# Patient Record
Sex: Female | Born: 1965 | Race: Black or African American | Hispanic: No | Marital: Married | Smoking: Never smoker
Health system: Southern US, Community
[De-identification: ages and names within clinical notes are randomized; demographics above are authoritative.]

## PROBLEM LIST (undated history)

## (undated) DIAGNOSIS — N289 Disorder of kidney and ureter, unspecified: Secondary | ICD-10-CM

## (undated) DIAGNOSIS — N200 Calculus of kidney: Secondary | ICD-10-CM

## (undated) DIAGNOSIS — M47819 Spondylosis without myelopathy or radiculopathy, site unspecified: Secondary | ICD-10-CM

## (undated) DIAGNOSIS — I10 Essential (primary) hypertension: Secondary | ICD-10-CM

## (undated) DIAGNOSIS — M109 Gout, unspecified: Secondary | ICD-10-CM

## (undated) HISTORY — PX: ADENOIDECTOMY: SUR15

## (undated) HISTORY — PX: ABDOMINAL HYSTERECTOMY: SHX81

## (undated) HISTORY — PX: TUBAL LIGATION: SHX77

## (undated) HISTORY — PX: OTHER SURGICAL HISTORY: SHX169

## (undated) HISTORY — PX: TONSILLECTOMY: SUR1361

---

## 2009-12-13 ENCOUNTER — Emergency Department (HOSPITAL_COMMUNITY): Admission: EM | Admit: 2009-12-13 | Discharge: 2009-12-13 | Payer: Self-pay | Admitting: Emergency Medicine

## 2010-02-27 ENCOUNTER — Emergency Department (HOSPITAL_COMMUNITY): Admission: EM | Admit: 2010-02-27 | Discharge: 2010-02-27 | Payer: Self-pay | Admitting: Emergency Medicine

## 2010-04-15 ENCOUNTER — Emergency Department (HOSPITAL_COMMUNITY): Admission: EM | Admit: 2010-04-15 | Discharge: 2010-04-15 | Payer: Self-pay | Admitting: Emergency Medicine

## 2010-05-01 ENCOUNTER — Emergency Department (HOSPITAL_COMMUNITY): Admission: EM | Admit: 2010-05-01 | Discharge: 2010-05-01 | Payer: Self-pay | Admitting: Emergency Medicine

## 2010-08-28 ENCOUNTER — Encounter: Payer: Self-pay | Admitting: Pain Medicine

## 2010-10-21 LAB — RAPID URINE DRUG SCREEN, HOSP PERFORMED
Amphetamines: NOT DETECTED
Barbiturates: NOT DETECTED
Benzodiazepines: NOT DETECTED
Cocaine: NOT DETECTED
Opiates: POSITIVE — AB
Tetrahydrocannabinol: NOT DETECTED

## 2010-10-21 LAB — URINALYSIS, ROUTINE W REFLEX MICROSCOPIC
Bilirubin Urine: NEGATIVE
Bilirubin Urine: NEGATIVE
Glucose, UA: NEGATIVE mg/dL
Glucose, UA: NEGATIVE mg/dL
Hgb urine dipstick: NEGATIVE
Hgb urine dipstick: NEGATIVE
Ketones, ur: 15 mg/dL — AB
Ketones, ur: NEGATIVE mg/dL
Leukocytes, UA: NEGATIVE
Nitrite: NEGATIVE
Nitrite: NEGATIVE
Protein, ur: NEGATIVE mg/dL
Protein, ur: 100 mg/dL — AB
Specific Gravity, Urine: 1.02 (ref 1.005–1.030)
Specific Gravity, Urine: 1.015 (ref 1.005–1.030)
Urobilinogen, UA: 1 mg/dL (ref 0.0–1.0)
Urobilinogen, UA: 0.2 mg/dL (ref 0.0–1.0)
pH: 7 (ref 5.0–8.0)
pH: 6.5 (ref 5.0–8.0)

## 2010-10-21 LAB — COMPREHENSIVE METABOLIC PANEL
ALT: 22 U/L (ref 0–35)
ALT: 21 U/L (ref 0–35)
AST: 26 U/L (ref 0–37)
AST: 28 U/L (ref 0–37)
Albumin: 4.2 g/dL (ref 3.5–5.2)
Albumin: 4.1 g/dL (ref 3.5–5.2)
Alkaline Phosphatase: 41 U/L (ref 39–117)
Alkaline Phosphatase: 42 U/L (ref 39–117)
BUN: 15 mg/dL (ref 6–23)
BUN: 12 mg/dL (ref 6–23)
CO2: 28 mEq/L (ref 19–32)
CO2: 28 mEq/L (ref 19–32)
Calcium: 9.6 mg/dL (ref 8.4–10.5)
Calcium: 9.2 mg/dL (ref 8.4–10.5)
Chloride: 103 mEq/L (ref 96–112)
Chloride: 104 mEq/L (ref 96–112)
Creatinine, Ser: 0.87 mg/dL (ref 0.4–1.2)
Creatinine, Ser: 1.02 mg/dL (ref 0.4–1.2)
GFR calc Af Amer: >60 mL/min (ref >60)
GFR calc Af Amer: >60 mL/min (ref >60)
GFR calc non Af Amer: >60 mL/min (ref >60)
GFR calc non Af Amer: 59 mL/min — ABNORMAL LOW (ref >60)
Glucose, Bld: 101 mg/dL — ABNORMAL HIGH (ref 70–99)
Glucose, Bld: 116 mg/dL — ABNORMAL HIGH (ref 70–99)
Potassium: 3.4 mEq/L — ABNORMAL LOW (ref 3.5–5.1)
Potassium: 2.6 mEq/L — CL (ref 3.5–5.1)
Sodium: 141 mEq/L (ref 135–145)
Sodium: 142 mEq/L (ref 135–145)
Total Bilirubin: 1 mg/dL (ref 0.3–1.2)
Total Bilirubin: 0.4 mg/dL (ref 0.3–1.2)
Total Protein: 7.7 g/dL (ref 6.0–8.3)
Total Protein: 7.5 g/dL (ref 6.0–8.3)

## 2010-10-21 LAB — CBC
HCT: 38.5 % (ref 36.0–46.0)
HCT: 40.5 % (ref 36.0–46.0)
Hemoglobin: 12.9 g/dL (ref 12.0–15.0)
Hemoglobin: 13.4 g/dL (ref 12.0–15.0)
MCH: 28 pg (ref 26.0–34.0)
MCH: 27.5 pg (ref 26.0–34.0)
MCHC: 33.5 g/dL (ref 30.0–36.0)
MCHC: 33.1 g/dL (ref 30.0–36.0)
MCV: 83.6 fL (ref 78.0–100.0)
MCV: 83 fL (ref 78.0–100.0)
Platelets: 197 10*3/uL (ref 150–400)
Platelets: 170 10*3/uL (ref 150–400)
RBC: 4.6 MIL/uL (ref 3.87–5.11)
RBC: 4.88 MIL/uL (ref 3.87–5.11)
RDW: 15.4 % (ref 11.5–15.5)
RDW: 14.8 % (ref 11.5–15.5)
WBC: 5.9 10*3/uL (ref 4.0–10.5)
WBC: 7.7 10*3/uL (ref 4.0–10.5)

## 2010-10-21 LAB — DIFFERENTIAL
Basophils Absolute: 0.1 10*3/uL (ref 0.0–0.1)
Basophils Absolute: 0.1 10*3/uL (ref 0.0–0.1)
Basophils Relative: 1 % (ref 0–1)
Basophils Relative: 1 % (ref 0–1)
Eosinophils Absolute: 0 10*3/uL (ref 0.0–0.7)
Eosinophils Absolute: 0 10*3/uL (ref 0.0–0.7)
Eosinophils Relative: 0 % (ref 0–5)
Eosinophils Relative: 0 % (ref 0–5)
Lymphocytes Relative: 35 % (ref 12–46)
Lymphocytes Relative: 26 % (ref 12–46)
Lymphs Abs: 2.1 10*3/uL (ref 0.7–4.0)
Lymphs Abs: 2 10*3/uL (ref 0.7–4.0)
Monocytes Absolute: 0.4 10*3/uL (ref 0.1–1.0)
Monocytes Absolute: 0.7 10*3/uL (ref 0.1–1.0)
Monocytes Relative: 6 % (ref 3–12)
Monocytes Relative: 9 % (ref 3–12)
Neutro Abs: 3.3 10*3/uL (ref 1.7–7.7)
Neutro Abs: 5 10*3/uL (ref 1.7–7.7)
Neutrophils Relative %: 58 % (ref 43–77)
Neutrophils Relative %: 64 % (ref 43–77)

## 2010-10-21 LAB — LIPASE, BLOOD: Lipase: 35 U/L (ref 11–59)

## 2010-10-21 LAB — URINE MICROSCOPIC-ADD ON

## 2010-10-21 LAB — ETHANOL: Alcohol, Ethyl (B): <5 mg/dL (ref 0–10)

## 2010-10-21 LAB — TSH: TSH: 0.152 u[IU]/mL — ABNORMAL LOW (ref 0.350–4.500)

## 2010-10-21 LAB — PREGNANCY, URINE: Preg Test, Ur: NEGATIVE

## 2010-10-21 LAB — POTASSIUM: Potassium: 3.1 mEq/L — ABNORMAL LOW (ref 3.5–5.1)

## 2011-03-28 ENCOUNTER — Emergency Department (HOSPITAL_COMMUNITY)
Admission: EM | Admit: 2011-03-28 | Discharge: 2011-03-28 | Disposition: A | Discharge status: Home / Self Care | Attending: Emergency Medicine | Admitting: Emergency Medicine

## 2011-03-28 ENCOUNTER — Emergency Department (HOSPITAL_COMMUNITY)

## 2011-03-28 DIAGNOSIS — M545 Low back pain, unspecified: Secondary | ICD-10-CM | POA: Insufficient documentation

## 2011-03-28 DIAGNOSIS — J45909 Unspecified asthma, uncomplicated: Secondary | ICD-10-CM | POA: Insufficient documentation

## 2011-03-28 DIAGNOSIS — F329 Major depressive disorder, single episode, unspecified: Secondary | ICD-10-CM | POA: Insufficient documentation

## 2011-03-28 DIAGNOSIS — M25559 Pain in unspecified hip: Secondary | ICD-10-CM | POA: Insufficient documentation

## 2011-03-28 DIAGNOSIS — I10 Essential (primary) hypertension: Secondary | ICD-10-CM | POA: Insufficient documentation

## 2011-03-28 DIAGNOSIS — F3289 Other specified depressive episodes: Secondary | ICD-10-CM | POA: Insufficient documentation

## 2011-03-28 DIAGNOSIS — M5430 Sciatica, unspecified side: Secondary | ICD-10-CM | POA: Insufficient documentation

## 2011-08-01 ENCOUNTER — Other Ambulatory Visit: Payer: Self-pay

## 2011-08-01 ENCOUNTER — Emergency Department (HOSPITAL_COMMUNITY)
Admission: EM | Admit: 2011-08-01 | Discharge: 2011-08-01 | Disposition: A | Discharge status: Home / Self Care | Attending: Emergency Medicine | Admitting: Emergency Medicine

## 2011-08-01 ENCOUNTER — Encounter: Payer: Self-pay | Admitting: *Deleted

## 2011-08-01 DIAGNOSIS — IMO0001 Reserved for inherently not codable concepts without codable children: Secondary | ICD-10-CM | POA: Insufficient documentation

## 2011-08-01 DIAGNOSIS — M7918 Myalgia, other site: Secondary | ICD-10-CM

## 2011-08-01 DIAGNOSIS — E669 Obesity, unspecified: Secondary | ICD-10-CM | POA: Insufficient documentation

## 2011-08-01 DIAGNOSIS — R079 Chest pain, unspecified: Secondary | ICD-10-CM | POA: Insufficient documentation

## 2011-08-01 DIAGNOSIS — I10 Essential (primary) hypertension: Secondary | ICD-10-CM | POA: Insufficient documentation

## 2011-08-01 HISTORY — DX: Essential (primary) hypertension: I10

## 2011-08-01 HISTORY — DX: Spondylosis without myelopathy or radiculopathy, site unspecified: M47.819

## 2011-08-01 MED ORDER — IBUPROFEN 800 MG PO TABS
800.0000 mg | ORAL_TABLET | Freq: Once | ORAL | Status: AC
  Administered 2011-08-01: 800 mg via ORAL
  Filled 2011-08-01: qty 1

## 2011-08-01 MED ORDER — CYCLOBENZAPRINE HCL 10 MG PO TABS
10.0000 mg | ORAL_TABLET | Freq: Once | ORAL | Status: AC
  Administered 2011-08-01: 10 mg via ORAL
  Filled 2011-08-01: qty 1

## 2011-08-01 MED ORDER — CYCLOBENZAPRINE HCL 10 MG PO TABS: 10.0000 mg | ORAL_TABLET | Freq: Three times a day (TID) | ORAL | Status: AC | PRN

## 2011-08-01 NOTE — ED Notes (Signed)
Pt states "the pain started yesterday in my right chest, I feel it in my right arm, into my back, it hurts worse with movement"; pt in NAD

## 2011-08-01 NOTE — ED Provider Notes (Signed)
History     CSN: [id number]  Arrival date & time 08/01/11  1150   First MD Initiated Contact with Patient 08/01/11 1248      Chief Complaint  Patient presents with  . Chest Pain    RT side, RT shoulder, under RT arm since yesterday. Worse w/deep breath and movement.    (Consider location/radiation/quality/duration/timing/severity/associated sxs/prior treatment) Patient is a 45 y.o. female presenting with chest pain. The history is provided by the patient. No language interpreter was used.  Chest Pain The chest pain began yesterday. Duration of episode(s) is 15 hours. Chest pain occurs constantly. The chest pain is unchanged. The pain is associated with lifting (certain positions and palpatation). At its most intense, the pain is at 8/10. The pain is currently at 6/10. The quality of the pain is described as aching. Chest pain is worsened by certain positions. Pertinent negatives for primary symptoms include no fever, no fatigue, no syncope, no shortness of breath, no cough, no wheezing, no palpitations, no abdominal pain, no nausea, no vomiting and no dizziness.  Pertinent negatives for associated symptoms include no near-syncope, no numbness and no weakness. She tried narcotics for the symptoms. Risk factors include obesity and lack of exercise.  Her past medical history is significant for hypertension.  Pertinent negatives for past medical history include no aneurysm, no anxiety/panic attacks, no aortic aneurysm, no arrhythmia, no CAD, no cancer, no COPD, no CHF, no diabetes, no DVT, no PE, no PVD, no recent injury, no seizures, no strokes and no TIA.  Pertinent negatives for family medical history include: no PE in family.    45 year old obese female coming in today with complaint of right chest and. States that she took her usual oxycodone and OxyContin without relief. States that the pain started last night and in the right chest, shoulder and back. Pain is worse with movement. She can  lift her right upper extremity but it is painful in her shoulder and right chest. EKG today is normal with no changes. Denies shortness of breath nausea vomiting or diarrhea today. Denies dizziness.  Past Medical History  Diagnosis Date  . Hypertension   . Facet joint disease     Past Surgical History  Procedure Date  . Abdominal hysterectomy   . Oophrectomy     right  . Tubal ligation   . Kidney stone removal   . Keloid removal   . Tonsillectomy   . Adenoidectomy     No family history on file.  History  Substance Use Topics  . Smoking status: Never Smoker   . Smokeless tobacco: Not on file  . Alcohol Use: No    OB History    Grav Para Term Preterm Abortions TAB SAB Ect Mult Living                  Review of Systems  Constitutional: Negative for fever and fatigue.  Respiratory: Negative for cough, shortness of breath and wheezing.   Cardiovascular: Positive for chest pain. Negative for palpitations, syncope and near-syncope.  Gastrointestinal: Negative for nausea, vomiting and abdominal pain.  Neurological: Negative for dizziness, seizures, weakness and numbness.  All other systems reviewed and are negative.    Allergies  Erythromycin base  Home Medications   Current Outpatient Rx  Name Route Sig Dispense Refill  . NAPROXEN SODIUM 220 MG PO TABS Oral Take 220 mg by mouth 2 (two) times daily with a meal.      . NEBIVOLOL HCL 10 MG PO  TABS Oral Take 10 mg by mouth daily.      Marland Kitchen OLMESARTAN MEDOXOMIL 40 MG PO TABS Oral Take 40 mg by mouth daily.      . OXYCODONE HCL ER 40 MG PO TB12 Oral Take 40 mg by mouth every 12 (twelve) hours.      . OXYCODONE HCL 15 MG PO TABS Oral Take 15 mg by mouth every 4 (four) hours as needed. BREAKTHROUGH PAIN     . SPIRONOLACTONE 50 MG PO TABS Oral Take 50 mg by mouth daily.        BP 162/93  Pulse 68  Temp(Src) 99 F (37.2 C) (Oral)  Resp 20  Wt 237 lb (107.502 kg)  SpO2 98%  Physical Exam  Nursing note and vitals  reviewed. Constitutional: She is oriented to person, place, and time. She appears well-developed and well-nourished.       obese  HENT:  Head: Normocephalic.  Eyes: Pupils are equal, round, and reactive to light.  Neck: Normal range of motion.  Cardiovascular: Normal rate, regular rhythm, normal heart sounds and intact distal pulses.  Exam reveals no gallop and no friction rub.   No murmur heard. Pulmonary/Chest: Effort normal and breath sounds normal. No respiratory distress. She has no wheezes. She has no rales. She exhibits tenderness.  Abdominal: Soft.  Musculoskeletal: She exhibits tenderness. She exhibits no edema.       R shoulder/R scapula/RU chest tender with palpatation  Neurological: She is alert and oriented to person, place, and time.  Skin: Skin is warm and dry.  Psychiatric: She has a normal mood and affect.    ED Course  Procedures (including critical care time)  Labs Reviewed - No data to display No results found.   No diagnosis found.    MDM     Date: 08/01/2011  Rate: 66   Rhythm: normal sinus rhythm  QRS Axis: normal  Intervals: normal  ST/T Wave abnormalities: normal  Conduction Disutrbances:none  Narrative Interpretation:   Old EKG Reviewed: unchanged  Musculoskeletal chest pain to R chest/R shoulder and R scapula.  Sleeps on her R side with arm under her head.  EKG normal.  No SOB.  The pain is constant and progressive.  Spasm palpated under R scapula.  Some relief with ibu and flexeril.  Will follow up with a PCP if not better.      Jethro Bastos, NP 08/03/11 1106

## 2011-08-06 NOTE — ED Provider Notes (Signed)
Medical screening examination/treatment/procedure(s) were performed by non-physician practitioner and as supervising physician I was immediately available for consultation/collaboration.  Raeford Razor, MD 08/06/11 [phone]

## 2011-09-23 ENCOUNTER — Encounter (HOSPITAL_COMMUNITY): Payer: Self-pay | Admitting: *Deleted

## 2011-09-23 ENCOUNTER — Emergency Department (HOSPITAL_COMMUNITY)
Admission: EM | Admit: 2011-09-23 | Discharge: 2011-09-23 | Disposition: A | Discharge status: Home / Self Care | Attending: Emergency Medicine | Admitting: Emergency Medicine

## 2011-09-23 ENCOUNTER — Emergency Department (HOSPITAL_COMMUNITY)

## 2011-09-23 DIAGNOSIS — Z79899 Other long term (current) drug therapy: Secondary | ICD-10-CM | POA: Insufficient documentation

## 2011-09-23 DIAGNOSIS — R0789 Other chest pain: Secondary | ICD-10-CM | POA: Insufficient documentation

## 2011-09-23 DIAGNOSIS — R05 Cough: Secondary | ICD-10-CM | POA: Insufficient documentation

## 2011-09-23 DIAGNOSIS — I10 Essential (primary) hypertension: Secondary | ICD-10-CM | POA: Insufficient documentation

## 2011-09-23 DIAGNOSIS — R059 Cough, unspecified: Secondary | ICD-10-CM | POA: Insufficient documentation

## 2011-09-23 DIAGNOSIS — J45909 Unspecified asthma, uncomplicated: Secondary | ICD-10-CM | POA: Insufficient documentation

## 2011-09-23 MED ORDER — PREDNISONE 10 MG PO TABS: 20.0000 mg | ORAL_TABLET | Freq: Every day | ORAL | Status: DC

## 2011-09-23 MED ORDER — ALBUTEROL (5 MG/ML) CONTINUOUS INHALATION SOLN
10.0000 mg/h | INHALATION_SOLUTION | Freq: Once | RESPIRATORY_TRACT | Status: AC
  Administered 2011-09-23: 10 mg/h via RESPIRATORY_TRACT

## 2011-09-23 MED ORDER — PREDNISONE 20 MG PO TABS
60.0000 mg | ORAL_TABLET | Freq: Once | ORAL | Status: AC
  Administered 2011-09-23: 60 mg via ORAL
  Filled 2011-09-23: qty 3

## 2011-09-23 MED ORDER — IPRATROPIUM BROMIDE 0.02 % IN SOLN
0.5000 mg | Freq: Once | RESPIRATORY_TRACT | Status: AC
  Administered 2011-09-23: 0.5 mg via RESPIRATORY_TRACT
  Filled 2011-09-23: qty 2.5

## 2011-09-23 NOTE — ED Notes (Signed)
Pt in c/o shortness of breath, states she has a history of asthma, increased use of breathing treatments at home, denies cough

## 2011-09-23 NOTE — ED Provider Notes (Signed)
History     CSN: [id number]  Arrival date & time 09/23/11  1127   First MD Initiated Contact with Patient 09/23/11 1159      Chief Complaint  Patient presents with  . Asthma    (Consider location/radiation/quality/duration/timing/severity/associated sxs/prior treatment) Patient is a 46 y.o. female presenting with asthma. The history is provided by the patient.  Asthma   patient presents with worsening asthma symptoms for the past week. Patient has history of asthma has been using her home nebulizer without relief. To receive an injection of steroids a week ago. No fever some cough. Notes chest tightness which she describes as difficult to take a deep breath. No exertional anginal symptoms. No vomiting or diarrhea. No headache or abdominal pain.  Past Medical History  Diagnosis Date  . Hypertension   . Facet joint disease   . Asthma     Past Surgical History  Procedure Date  . Abdominal hysterectomy   . Oophrectomy     right  . Tubal ligation   . Kidney stone removal   . Keloid removal   . Tonsillectomy   . Adenoidectomy     History reviewed. No pertinent family history.  History  Substance Use Topics  . Smoking status: Never Smoker   . Smokeless tobacco: Not on file  . Alcohol Use: No    OB History    Grav Para Term Preterm Abortions TAB SAB Ect Mult Living                  Review of Systems  All other systems reviewed and are negative.    Allergies  Erythromycin base  Home Medications   Current Outpatient Rx  Name Route Sig Dispense Refill  . ALBUTEROL SULFATE HFA 108 (90 BASE) MCG/ACT IN AERS Inhalation Inhale 2 puffs into the lungs every 6 (six) hours as needed. Wheezing and shortness of breath    . ALBUTEROL SULFATE (2.5 MG/3ML) 0.083% IN NEBU Nebulization Take 2.5 mg by nebulization 2 (two) times daily.    . ALLOPURINOL 100 MG PO TABS Oral Take 100 mg by mouth daily.    Marland Kitchen AMLODIPINE BESYLATE 5 MG PO TABS Oral Take 5 mg by mouth daily.    Marland Kitchen  VITAMIN D 1000 UNITS PO TABS Oral Take 1,000 Units by mouth daily.    . FUROSEMIDE 20 MG PO TABS Oral Take 20 mg by mouth 2 (two) times daily.    . IBUPROFEN 800 MG PO TABS Oral Take 800 mg by mouth every 8 (eight) hours as needed. pain    . NEBIVOLOL HCL 10 MG PO TABS Oral Take 10 mg by mouth daily.      Marland Kitchen OLMESARTAN MEDOXOMIL-HCTZ 40-25 MG PO TABS Oral Take 1 tablet by mouth daily.    . OXYCODONE HCL ER 40 MG PO TB12 Oral Take 40 mg by mouth every 12 (twelve) hours.      . OXYCODONE HCL 7.5 MG PO TABS Oral Take 7.5 mg by mouth every 6 (six) hours as needed. Breakthrough pain      BP 172/93  Pulse 80  Temp(Src) 98.3 F (36.8 C) (Oral)  Resp 16  Wt 250 lb (113.399 kg)  SpO2 98%  Physical Exam  Nursing note and vitals reviewed. Constitutional: She is oriented to person, place, and time. She appears well-developed and well-nourished.  Non-toxic appearance. No distress.  HENT:  Head: Normocephalic and atraumatic.  Eyes: Conjunctivae, EOM and lids are normal. Pupils are equal, round, and reactive to  light.  Neck: Normal range of motion. Neck supple. No tracheal deviation present. No mass present.  Cardiovascular: Normal rate, regular rhythm and normal heart sounds.  Exam reveals no gallop.   No murmur heard. Pulmonary/Chest: Effort normal. No stridor. No respiratory distress. She has no decreased breath sounds. She has wheezes. She has no rhonchi. She has no rales.  Abdominal: Soft. Normal appearance and bowel sounds are normal. She exhibits no distension. There is no tenderness. There is no rebound and no CVA tenderness.  Musculoskeletal: Normal range of motion. She exhibits no edema and no tenderness.  Neurological: She is alert and oriented to person, place, and time. She has normal strength. No cranial nerve deficit or sensory deficit. GCS eye subscore is 4. GCS verbal subscore is 5. GCS motor subscore is 6.  Skin: Skin is warm and dry. No abrasion and no rash noted.  Psychiatric: She  has a normal mood and affect. Her speech is normal and behavior is normal.    ED Course  Procedures (including critical care time)  Labs Reviewed - No data to display No results found.   No diagnosis found.    MDM  Patient given prednisone and albuterol continuous nebulizer. Patient feels better after albuterol treatment lung exam repeat check in lungs have improved. Patient to be discharged home        Toy Baker, MD 09/23/11 1440

## 2012-02-18 ENCOUNTER — Encounter (HOSPITAL_COMMUNITY): Payer: Self-pay | Admitting: *Deleted

## 2012-02-18 ENCOUNTER — Emergency Department (HOSPITAL_COMMUNITY)
Admission: EM | Admit: 2012-02-18 | Discharge: 2012-02-18 | Disposition: A | Discharge status: Home / Self Care | Attending: Emergency Medicine | Admitting: Emergency Medicine

## 2012-02-18 DIAGNOSIS — J45909 Unspecified asthma, uncomplicated: Secondary | ICD-10-CM | POA: Insufficient documentation

## 2012-02-18 DIAGNOSIS — T7840XA Allergy, unspecified, initial encounter: Secondary | ICD-10-CM

## 2012-02-18 DIAGNOSIS — R109 Unspecified abdominal pain: Secondary | ICD-10-CM | POA: Insufficient documentation

## 2012-02-18 DIAGNOSIS — L299 Pruritus, unspecified: Secondary | ICD-10-CM | POA: Insufficient documentation

## 2012-02-18 DIAGNOSIS — R21 Rash and other nonspecific skin eruption: Secondary | ICD-10-CM | POA: Insufficient documentation

## 2012-02-18 DIAGNOSIS — R110 Nausea: Secondary | ICD-10-CM | POA: Insufficient documentation

## 2012-02-18 DIAGNOSIS — I10 Essential (primary) hypertension: Secondary | ICD-10-CM | POA: Insufficient documentation

## 2012-02-18 MED ORDER — PREDNISONE 20 MG PO TABS: 60.0000 mg | ORAL_TABLET | Freq: Every day | ORAL | Status: AC

## 2012-02-18 MED ORDER — HYDROXYZINE HCL 25 MG PO TABS
50.0000 mg | ORAL_TABLET | Freq: Once | ORAL | Status: AC
  Administered 2012-02-18: 50 mg via ORAL
  Filled 2012-02-18: qty 2

## 2012-02-18 MED ORDER — FAMOTIDINE 20 MG PO TABS
20.0000 mg | ORAL_TABLET | Freq: Once | ORAL | Status: AC
  Administered 2012-02-18: 20 mg via ORAL
  Filled 2012-02-18: qty 1

## 2012-02-18 MED ORDER — HYDROXYZINE HCL 25 MG PO TABS: 25.0000 mg | ORAL_TABLET | Freq: Four times a day (QID) | ORAL | Status: AC

## 2012-02-18 MED ORDER — HYDROXYZINE HCL 50 MG/ML IM SOLN: 50.0000 mg | Freq: Four times a day (QID) | INTRAMUSCULAR | Status: DC | PRN

## 2012-02-18 NOTE — ED Notes (Signed)
Pt reports headache, stomach cramps and rash. Pt reports Wednesday noted rash and hives. Pt saw PCP on Thursday and was given steroid shot and benadryl.  Pt reports itching is worse and abdominal pain is worse. Pt reports itching is across entire body.

## 2012-02-18 NOTE — ED Provider Notes (Signed)
History     CSN: [id number]  Arrival date & time 02/18/12  1810   First MD Initiated Contact with Patient 02/18/12 1834      Chief Complaint  Patient presents with  . Rash  . Abdominal Cramping  . Nausea  . Pruritis    (Consider location/radiation/quality/duration/timing/severity/associated sxs/prior treatment) HPI Comments: Patient presents today with a chief complaint of itchiness.  Patient reports that she thinks that she had an allergic reaction 4 days ago.  She developed hives.  She then went to see her PCP three days ago and was given a shot of Prednisone and was instructed to take Benadryl 50 mg.  She reports that her rash did then improve.  However, she feels that the rash worsened and itching worsened again today.  She does not feel that they Benadryl is helping with the itching.  She is unsure what she is having an allergic reaction to.  No SOB.  No swelling of lips, tongue, or throat.  She reports that she is feeling somewhat nauseous.  No abdominal pain.    The history is provided by the patient.    Past Medical History  Diagnosis Date  . Hypertension   . Facet joint disease   . Asthma     Past Surgical History  Procedure Date  . Abdominal hysterectomy   . Oophrectomy     right  . Tubal ligation   . Kidney stone removal   . Keloid removal   . Tonsillectomy   . Adenoidectomy     No family history on file.  History  Substance Use Topics  . Smoking status: Never Smoker   . Smokeless tobacco: Not on file  . Alcohol Use: No    OB History    Grav Para Term Preterm Abortions TAB SAB Ect Mult Living                  Review of Systems  Constitutional: Negative for fever and chills.  HENT: Negative for facial swelling, trouble swallowing, neck pain and neck stiffness.   Respiratory: Negative for shortness of breath and wheezing.   Cardiovascular: Negative for chest pain.  Gastrointestinal: Negative for nausea, vomiting and abdominal pain.  Skin:  Positive for rash.  Neurological: Negative for dizziness, syncope and light-headedness.    Allergies  Erythromycin base  Home Medications   Current Outpatient Rx  Name Route Sig Dispense Refill  . ALBUTEROL SULFATE HFA 108 (90 BASE) MCG/ACT IN AERS Inhalation Inhale 2 puffs into the lungs every 6 (six) hours as needed. Wheezing and shortness of breath    . ALBUTEROL SULFATE (2.5 MG/3ML) 0.083% IN NEBU Nebulization Take 2.5 mg by nebulization 2 (two) times daily.    Marland Kitchen AMLODIPINE BESYLATE 5 MG PO TABS Oral Take 5 mg by mouth daily.    Marland Kitchen VITAMIN D 1000 UNITS PO TABS Oral Take 1,000 Units by mouth daily.    Marland Kitchen DIPHENHYDRAMINE HCL 25 MG PO TABS Oral Take 25 mg by mouth every 6 (six) hours as needed. Allergic reaction    . FUROSEMIDE 20 MG PO TABS Oral Take 20 mg by mouth 2 (two) times daily.    . NEBIVOLOL HCL 10 MG PO TABS Oral Take 10 mg by mouth daily.     Marland Kitchen OLMESARTAN MEDOXOMIL-HCTZ 40-25 MG PO TABS Oral Take 1 tablet by mouth daily.    . OXYCODONE HCL ER 40 MG PO TB12 Oral Take 20 mg by mouth every 12 (twelve) hours. Pain    .  TRIAMCINOLONE ACETONIDE 40 MG/ML IJ SUSP Intramuscular Inject 40 mg into the muscle once.      BP 138/89  Pulse 93  Temp 98.1 F (36.7 C) (Oral)  Resp 17  SpO2 99%  Physical Exam  Nursing note and vitals reviewed. Constitutional: She is oriented to person, place, and time. She appears well-developed and well-nourished. No distress.  HENT:  Head: Normocephalic and atraumatic.  Mouth/Throat: Oropharynx is clear and moist and mucous membranes are normal.       No sign of airway obstruction. No edema of face, eyelids, lips, tongue, uvula.Marland Kitchen Uvula midline, no nasal congestion or drooling.  Tongue not elevated. No trismus.  Neck: Normal range of motion and phonation normal. Neck supple. No mass present.           Cardiovascular: Normal rate, regular rhythm, normal heart sounds, intact distal pulses and normal pulses.        Not tachycardic  Pulmonary/Chest:  Effort normal and breath sounds normal. No respiratory distress. She has no wheezes. She has no rales.  Abdominal: Soft. There is no tenderness.  Musculoskeletal: Normal range of motion.  Neurological: She is alert and oriented to person, place, and time.  Skin: Skin is warm, dry and intact. No rash noted. She is not diaphoretic. No erythema.       Several scratch marks.  No urticaria or other rash visualized.  Psychiatric: She has a normal mood and affect. Her behavior is normal.    ED Course  Procedures (including critical care time)  Labs Reviewed - No data to display No results found.   No diagnosis found.  9:18 PM Patient reports that symptoms have improved.  MDM  Patient presenting with what she thinks to be an allergic reaction.  She was treated by her PCP for an allergic reaction 3 days ago.  Symptoms improved, but have no returned.  No rash visualized on physical exam today.  However, patient having a lot of itching.  Taken Benadryl without relief.  Patient therefore given Vistaril and itching improved.  She denies SOB.  No swelling of lips, tongue, or throat.          Pascal Lux [place], PA-C 02/19/12 1258

## 2012-02-18 NOTE — ED Notes (Signed)
Pt reports she finished her last dose of penicillin on Wednesday. Pt reports she initially started to have hives Wednesday night after her last dose of penicillin

## 2012-02-20 NOTE — ED Provider Notes (Signed)
Medical screening examination/treatment/procedure(s) were performed by non-physician practitioner and as supervising physician I was immediately available for consultation/collaboration.   Leigh-Ann Webb, MD 02/20/12 0041 

## 2012-04-03 ENCOUNTER — Emergency Department (HOSPITAL_COMMUNITY)
Admission: EM | Admit: 2012-04-03 | Discharge: 2012-04-04 | Disposition: A | Discharge status: Home / Self Care | Attending: Emergency Medicine | Admitting: Emergency Medicine

## 2012-04-03 ENCOUNTER — Emergency Department (HOSPITAL_COMMUNITY)

## 2012-04-03 ENCOUNTER — Encounter (HOSPITAL_COMMUNITY): Payer: Self-pay | Admitting: Emergency Medicine

## 2012-04-03 DIAGNOSIS — K219 Gastro-esophageal reflux disease without esophagitis: Secondary | ICD-10-CM | POA: Insufficient documentation

## 2012-04-03 DIAGNOSIS — R110 Nausea: Secondary | ICD-10-CM | POA: Insufficient documentation

## 2012-04-03 DIAGNOSIS — F19239 Other psychoactive substance dependence with withdrawal, unspecified: Secondary | ICD-10-CM | POA: Insufficient documentation

## 2012-04-03 DIAGNOSIS — F19939 Other psychoactive substance use, unspecified with withdrawal, unspecified: Secondary | ICD-10-CM | POA: Insufficient documentation

## 2012-04-03 DIAGNOSIS — I10 Essential (primary) hypertension: Secondary | ICD-10-CM | POA: Insufficient documentation

## 2012-04-03 DIAGNOSIS — F1123 Opioid dependence with withdrawal: Secondary | ICD-10-CM

## 2012-04-03 DIAGNOSIS — M549 Dorsalgia, unspecified: Secondary | ICD-10-CM | POA: Insufficient documentation

## 2012-04-03 DIAGNOSIS — Z79899 Other long term (current) drug therapy: Secondary | ICD-10-CM | POA: Insufficient documentation

## 2012-04-03 DIAGNOSIS — R079 Chest pain, unspecified: Secondary | ICD-10-CM | POA: Insufficient documentation

## 2012-04-03 DIAGNOSIS — F1120 Opioid dependence, uncomplicated: Secondary | ICD-10-CM | POA: Insufficient documentation

## 2012-04-03 DIAGNOSIS — R509 Fever, unspecified: Secondary | ICD-10-CM | POA: Insufficient documentation

## 2012-04-03 DIAGNOSIS — R0602 Shortness of breath: Secondary | ICD-10-CM | POA: Insufficient documentation

## 2012-04-03 LAB — CBC
HCT: 39.7 % (ref 36.0–46.0)
Hemoglobin: 12.9 g/dL (ref 12.0–15.0)
MCH: 27.1 pg (ref 26.0–34.0)
MCHC: 32.5 g/dL (ref 30.0–36.0)
MCV: 83.4 fL (ref 78.0–100.0)
Platelets: 280 10*3/uL (ref 150–400)
RBC: 4.76 MIL/uL (ref 3.87–5.11)
RDW: 14.5 % (ref 11.5–15.5)
WBC: 8.8 10*3/uL (ref 4.0–10.5)

## 2012-04-03 LAB — BASIC METABOLIC PANEL
BUN: 8 mg/dL (ref 6–23)
CO2: 29 mEq/L (ref 19–32)
Calcium: 9.1 mg/dL (ref 8.4–10.5)
Chloride: 100 mEq/L (ref 96–112)
Creatinine, Ser: 0.75 mg/dL (ref 0.50–1.10)
GFR calc Af Amer: >90 mL/min (ref >90)
GFR calc non Af Amer: >90 mL/min (ref >90)
Glucose, Bld: 99 mg/dL (ref 70–99)
Potassium: 3.7 mEq/L (ref 3.5–5.1)
Sodium: 137 mEq/L (ref 135–145)

## 2012-04-03 LAB — URINALYSIS, ROUTINE W REFLEX MICROSCOPIC
Bilirubin Urine: NEGATIVE
Glucose, UA: NEGATIVE mg/dL
Hgb urine dipstick: NEGATIVE
Ketones, ur: NEGATIVE mg/dL
Leukocytes, UA: NEGATIVE
Nitrite: NEGATIVE
Protein, ur: NEGATIVE mg/dL
Specific Gravity, Urine: 1.02 (ref 1.005–1.030)
Urobilinogen, UA: 0.2 mg/dL (ref 0.0–1.0)
pH: 7.5 (ref 5.0–8.0)

## 2012-04-03 LAB — PRO B NATRIURETIC PEPTIDE: Pro B Natriuretic peptide (BNP): 34.2 pg/mL (ref 0–125)

## 2012-04-03 LAB — PREGNANCY, URINE: Preg Test, Ur: NEGATIVE

## 2012-04-03 LAB — POCT I-STAT TROPONIN I: Troponin i, poc: 0 ng/mL (ref 0.00–0.08)

## 2012-04-03 MED ORDER — CLONIDINE HCL 0.1 MG PO TABS
0.1000 mg | ORAL_TABLET | Freq: Once | ORAL | Status: AC
  Administered 2012-04-03: 0.1 mg via ORAL
  Filled 2012-04-03: qty 1

## 2012-04-03 NOTE — ED Notes (Signed)
Pt c/o mid-sternal chest pain and lower abd pain since Friday. Pt intermittent and sharp. Pt has been taking milk of magnesia for relief, but pain has gotten worse today. Pt states pain in abd and mid chest is 10/10. Pt also reports pain to back of shoulder blades since Monday. Pt c/o nausea "everyday".

## 2012-04-03 NOTE — ED Provider Notes (Signed)
History     CSN: [id number]  Arrival date & time 04/03/12  1647   First MD Initiated Contact with Patient 04/03/12 2135      Chief Complaint  Patient presents with  . Chest Pain    (Consider location/radiation/quality/duration/timing/severity/associated sxs/prior treatment) HPI Comments: Patient states, that she's been weaning herself off her chronic pain medications, which include OxyContin, 40 mg twice a day, as well as 10 mg oxycodone every 6 hours as needed.  For breakthrough pain.  She is currently being without the OxyContin for 3, days, and has only taken 1-2 oxycodone a day.  Since, then, in that period of time.  She has had diffuse abdominal crampy pain, night sweats, or feeling of agitation, nausea.  She also notes, that she's had some epigastric pain.  That is made worse with certain foods.  She was told several years ago, that she had gallbladder dysfunction.  Patient is a 46 y.o. female presenting with chest pain. The history is provided by the patient.  Chest Pain Chest pain occurs constantly. The chest pain is unchanged. The pain is associated with eating. At its most intense, the pain is at 8/10. The pain is currently at 4/10. The severity of the pain is moderate. The quality of the pain is described as stabbing. The pain radiates to the upper back. Chest pain is worsened by eating. Primary symptoms include a fever, abdominal pain and nausea. Pertinent negatives for primary symptoms include no shortness of breath, no cough, no vomiting and no dizziness.  Pertinent negatives for associated symptoms include no weakness.     Past Medical History  Diagnosis Date  . Hypertension   . Facet joint disease   . Asthma     Past Surgical History  Procedure Date  . Abdominal hysterectomy   . Oophrectomy     right  . Tubal ligation   . Kidney stone removal   . Keloid removal   . Tonsillectomy   . Adenoidectomy     No family history on file.  History  Substance Use  Topics  . Smoking status: Never Smoker   . Smokeless tobacco: Not on file  . Alcohol Use: No    OB History    Grav Para Term Preterm Abortions TAB SAB Ect Mult Living                  Review of Systems  Constitutional: Positive for fever.  Respiratory: Negative for cough and shortness of breath.   Cardiovascular: Positive for chest pain.  Gastrointestinal: Positive for nausea and abdominal pain. Negative for vomiting, diarrhea and constipation.  Skin: Negative for rash and wound.  Neurological: Negative for dizziness and weakness.    Allergies  Erythromycin base  Home Medications   Current Outpatient Rx  Name Route Sig Dispense Refill  . ALBUTEROL SULFATE HFA 108 (90 BASE) MCG/ACT IN AERS Inhalation Inhale 2 puffs into the lungs every 6 (six) hours as needed. Wheezing and shortness of breath    . AMLODIPINE BESYLATE 5 MG PO TABS Oral Take 5 mg by mouth daily.    Marland Kitchen DIPHENHYDRAMINE HCL 25 MG PO TABS Oral Take 25 mg by mouth every 6 (six) hours as needed. Allergic reaction    . FUROSEMIDE 20 MG PO TABS Oral Take 20 mg by mouth daily.     Marland Kitchen OLMESARTAN MEDOXOMIL-HCTZ 40-25 MG PO TABS Oral Take 1 tablet by mouth daily.    . OXYCODONE HCL ER 40 MG PO TB12 Oral Take  40 mg by mouth 2 (two) times daily as needed. Pain    . OXYCODONE-ACETAMINOPHEN 5-325 MG PO TABS Oral Take 1 tablet by mouth every 6 (six) hours as needed. For pain.    Marland Kitchen CLONIDINE HCL 0.1 MG PO TABS Oral Take 1 tablet (0.1 mg total) by mouth 4 (four) times daily. 30 tablet 0  . FAMOTIDINE 20 MG PO TABS Oral Take 1 tablet (20 mg total) by mouth 2 (two) times daily. 30 tablet 0    BP 126/65  Pulse 78  Temp 98.1 F (36.7 C) (Oral)  Resp 16  SpO2 99%  Physical Exam  Constitutional: She appears well-developed and well-nourished.  HENT:  Head: Normocephalic.  Eyes: Pupils are equal, round, and reactive to light.  Neck: Normal range of motion.  Cardiovascular: Normal rate.   Pulmonary/Chest: Effort normal.    Abdominal: Soft. Bowel sounds are normal. She exhibits no distension. There is tenderness in the epigastric area. There is no rebound.  Musculoskeletal: Normal range of motion.  Neurological: She is alert.  Skin: Skin is warm.    ED Course  Procedures (including critical care time)  Labs Reviewed  URINALYSIS, ROUTINE W REFLEX MICROSCOPIC - Abnormal; Notable for the following:    APPearance CLOUDY (*)     All other components within normal limits  HEPATIC FUNCTION PANEL - Abnormal; Notable for the following:    Total Bilirubin 0.2 (*)     All other components within normal limits  CBC  BASIC METABOLIC PANEL  PRO B NATRIURETIC PEPTIDE  PREGNANCY, URINE  POCT I-STAT TROPONIN I  HEPATITIS PANEL, ACUTE   Dg Chest 2 View  04/03/2012  *RADIOLOGY REPORT*  Clinical Data: Chest pain.  CHEST - 2 VIEW  Comparison: 09/23/2011  Findings: There is a vague 10 mm nodular density at the left lung base laterally.  I suspect this is a confluence of skin folds and osseous structures but I recommend a repeat PA chest upright to exclude a true pulmonary nodule.  The lungs are otherwise clear. Heart size and vascularity are normal.  Severe thoracolumbar scoliosis, unchanged.  IMPRESSION: Possible nodule at the left lung base.  Repeat upright PA chest is recommended to exclude a true pulmonary nodule.   Original Report Authenticated By: Gwynn Burly, M.D.      1. Opiate withdrawal   2. GERD (gastroesophageal reflux disease)       MDM   I feel that this is most likely having withdrawal symptoms from her chronic opioid use.  Treat with clonidine 0.1 mg, which she has been prescribed by her primary care physician, but she's only been taking one time at night.  We'll also investigate worsening, Gallbladder disease Reviewed.  Her labs she has no sign of infection.  Her hepatic function is normal, without any sign of gallbladder disease.  At this time.  Her overall abdominal cramping.  Has improved  drastically after the administration of 0.1 mg of clonidine.  She is still complaining of some epigastric, discomfort.  I've asked the nurse to give her a GI cocktail at this time. I plan to discharge.  This patient home with clonidine protocol for opiate withdrawal and treatment.  For GERD        Arman Filter, NP 04/04/12 0126

## 2012-04-04 LAB — HEPATIC FUNCTION PANEL
ALT: 25 U/L (ref 0–35)
AST: 27 U/L (ref 0–37)
Albumin: 3.8 g/dL (ref 3.5–5.2)
Alkaline Phosphatase: 56 U/L (ref 39–117)
Bilirubin, Direct: <0.1 mg/dL (ref 0.0–0.3)
Total Bilirubin: 0.2 mg/dL — ABNORMAL LOW (ref 0.3–1.2)
Total Protein: 7.3 g/dL (ref 6.0–8.3)

## 2012-04-04 LAB — HEPATITIS PANEL, ACUTE
HCV Ab: NEGATIVE
Hep A IgM: NEGATIVE
Hep B C IgM: NEGATIVE
Hepatitis B Surface Ag: NEGATIVE

## 2012-04-04 MED ORDER — GI COCKTAIL ~~LOC~~
30.0000 mL | Freq: Once | ORAL | Status: AC
  Administered 2012-04-04: 30 mL via ORAL
  Filled 2012-04-04: qty 30

## 2012-04-04 MED ORDER — CLONIDINE HCL 0.1 MG PO TABS: 0.1000 mg | ORAL_TABLET | Freq: Four times a day (QID) | ORAL | Status: DC

## 2012-04-04 MED ORDER — FAMOTIDINE 20 MG PO TABS: 20.0000 mg | ORAL_TABLET | Freq: Two times a day (BID) | ORAL | Status: DC

## 2012-04-04 NOTE — ED Notes (Signed)
Pt states that GI Cocktail "did relieve the pain".

## 2012-04-06 NOTE — ED Provider Notes (Signed)
Medical screening examination/treatment/procedure(s) were conducted as a shared visit with non-physician practitioner(s) and myself.  I personally evaluated the patient during the encounter  Toy Baker, MD 04/06/12 2348

## 2012-05-16 ENCOUNTER — Other Ambulatory Visit: Payer: Self-pay | Admitting: Gastroenterology

## 2012-05-16 DIAGNOSIS — R079 Chest pain, unspecified: Secondary | ICD-10-CM

## 2012-05-16 DIAGNOSIS — K219 Gastro-esophageal reflux disease without esophagitis: Secondary | ICD-10-CM

## 2012-05-21 ENCOUNTER — Other Ambulatory Visit

## 2012-06-25 ENCOUNTER — Ambulatory Visit
Admission: RE | Admit: 2012-06-25 | Discharge: 2012-06-25 | Disposition: A | Discharge status: Home / Self Care | Source: Ambulatory Visit | Attending: Gastroenterology | Admitting: Gastroenterology

## 2012-06-25 DIAGNOSIS — R079 Chest pain, unspecified: Secondary | ICD-10-CM

## 2012-06-25 DIAGNOSIS — K219 Gastro-esophageal reflux disease without esophagitis: Secondary | ICD-10-CM

## 2012-06-27 ENCOUNTER — Other Ambulatory Visit: Payer: Self-pay | Admitting: Gastroenterology

## 2012-06-27 DIAGNOSIS — R079 Chest pain, unspecified: Secondary | ICD-10-CM

## 2012-06-27 DIAGNOSIS — K219 Gastro-esophageal reflux disease without esophagitis: Secondary | ICD-10-CM

## 2012-07-11 ENCOUNTER — Encounter (HOSPITAL_COMMUNITY)
Admission: RE | Admit: 2012-07-11 | Discharge: 2012-07-11 | Disposition: A | Discharge status: Home / Self Care | Source: Ambulatory Visit | Attending: Gastroenterology | Admitting: Gastroenterology

## 2012-07-11 DIAGNOSIS — K219 Gastro-esophageal reflux disease without esophagitis: Secondary | ICD-10-CM

## 2012-07-11 DIAGNOSIS — R079 Chest pain, unspecified: Secondary | ICD-10-CM | POA: Insufficient documentation

## 2012-07-11 MED ORDER — TECHNETIUM TC 99M MEBROFENIN IV KIT
5.0000 | PACK | Freq: Once | INTRAVENOUS | Status: AC | PRN
  Administered 2012-07-11: 5 via INTRAVENOUS

## 2012-07-11 MED ORDER — SINCALIDE 5 MCG IJ SOLR
INTRAMUSCULAR | Status: AC
  Administered 2012-07-11: 2.32 ug via INTRAVENOUS
  Filled 2012-07-11: qty 10

## 2012-07-11 MED ORDER — SINCALIDE 5 MCG IJ SOLR
0.0200 ug/kg | Freq: Once | INTRAMUSCULAR | Status: AC
  Administered 2012-07-11: 2.32 ug via INTRAVENOUS

## 2012-07-25 ENCOUNTER — Ambulatory Visit (INDEPENDENT_AMBULATORY_CARE_PROVIDER_SITE_OTHER): Admitting: General Surgery

## 2012-07-26 ENCOUNTER — Ambulatory Visit (INDEPENDENT_AMBULATORY_CARE_PROVIDER_SITE_OTHER): Admitting: General Surgery

## 2012-09-16 ENCOUNTER — Emergency Department (HOSPITAL_BASED_OUTPATIENT_CLINIC_OR_DEPARTMENT_OTHER)
Admission: EM | Admit: 2012-09-16 | Discharge: 2012-09-16 | Disposition: A | Discharge status: Home / Self Care | Attending: Emergency Medicine | Admitting: Emergency Medicine

## 2012-09-16 ENCOUNTER — Encounter (HOSPITAL_BASED_OUTPATIENT_CLINIC_OR_DEPARTMENT_OTHER): Payer: Self-pay | Admitting: *Deleted

## 2012-09-16 DIAGNOSIS — I10 Essential (primary) hypertension: Secondary | ICD-10-CM | POA: Insufficient documentation

## 2012-09-16 DIAGNOSIS — Z79899 Other long term (current) drug therapy: Secondary | ICD-10-CM | POA: Insufficient documentation

## 2012-09-16 DIAGNOSIS — Z9079 Acquired absence of other genital organ(s): Secondary | ICD-10-CM | POA: Insufficient documentation

## 2012-09-16 DIAGNOSIS — Z87442 Personal history of urinary calculi: Secondary | ICD-10-CM | POA: Insufficient documentation

## 2012-09-16 DIAGNOSIS — J45909 Unspecified asthma, uncomplicated: Secondary | ICD-10-CM | POA: Insufficient documentation

## 2012-09-16 DIAGNOSIS — R350 Frequency of micturition: Secondary | ICD-10-CM | POA: Insufficient documentation

## 2012-09-16 DIAGNOSIS — Z9851 Tubal ligation status: Secondary | ICD-10-CM | POA: Insufficient documentation

## 2012-09-16 DIAGNOSIS — Z90710 Acquired absence of both cervix and uterus: Secondary | ICD-10-CM | POA: Insufficient documentation

## 2012-09-16 DIAGNOSIS — R109 Unspecified abdominal pain: Secondary | ICD-10-CM | POA: Insufficient documentation

## 2012-09-16 HISTORY — DX: Disorder of kidney and ureter, unspecified: N28.9

## 2012-09-16 HISTORY — DX: Gout, unspecified: M10.9

## 2012-09-16 LAB — URINALYSIS, ROUTINE W REFLEX MICROSCOPIC
Bilirubin Urine: NEGATIVE
Glucose, UA: NEGATIVE mg/dL
Hgb urine dipstick: NEGATIVE
Ketones, ur: NEGATIVE mg/dL
Nitrite: NEGATIVE
Protein, ur: NEGATIVE mg/dL
Specific Gravity, Urine: 1.012 (ref 1.005–1.030)
Urobilinogen, UA: 0.2 mg/dL (ref 0.0–1.0)
pH: 7 (ref 5.0–8.0)

## 2012-09-16 LAB — URINE MICROSCOPIC-ADD ON

## 2012-09-16 NOTE — ED Notes (Signed)
Pt states that she has a hx of kidney stones and this feels the same. +frequency

## 2012-11-12 ENCOUNTER — Ambulatory Visit: Payer: Self-pay | Admitting: Obstetrics & Gynecology

## 2012-12-06 ENCOUNTER — Ambulatory Visit: Payer: Self-pay | Admitting: Obstetrics & Gynecology

## 2013-02-20 ENCOUNTER — Ambulatory Visit: Admitting: Obstetrics & Gynecology

## 2013-02-20 ENCOUNTER — Encounter: Payer: Self-pay | Admitting: Obstetrics & Gynecology

## 2013-02-20 ENCOUNTER — Ambulatory Visit (INDEPENDENT_AMBULATORY_CARE_PROVIDER_SITE_OTHER): Admitting: Obstetrics & Gynecology

## 2013-02-20 VITALS — BP 153/88 | HR 77 | Temp 98.3°F | Ht 64.0 in | Wt 258.0 lb

## 2013-02-20 DIAGNOSIS — Z01419 Encounter for gynecological examination (general) (routine) without abnormal findings: Secondary | ICD-10-CM

## 2013-02-20 NOTE — Progress Notes (Signed)
.   Subjective:     Kristin Hahn is a 47 y.o. female here for a routine exam.  No current complaints.  Personal health questionnaire reviewed: no.   Gynecologic History No LMP recorded. Patient has had a hysterectomy. Fibroids Contraception: none Last Pap: 2012. Results were: normal Last mammogram: 02/19/2013 - results pending.  Obstetric History OB History   Grav Para Term Preterm Abortions TAB SAB Ect Mult Living                   The following portions of the patient's history were reviewed and updated as appropriate: allergies, current medications, past family history, past medical history, past social history, past surgical history and problem list.  Review of Systems Pertinent items are noted in HPI.    Objective:      General appearance: alert Breasts: normal appearance, no masses or tenderness Abdomen: soft, non-tender; bowel sounds normal; no masses,  no organomegaly Pelvic: external genitalia normal, no adnexal masses or tenderness and vagina normal without discharge       Assessment:    Healthy female exam.    Plan:    Return prn

## 2013-02-25 NOTE — Patient Instructions (Addendum)

## 2013-03-16 ENCOUNTER — Encounter: Payer: Self-pay | Admitting: Obstetrics & Gynecology

## 2013-03-18 ENCOUNTER — Encounter (INDEPENDENT_AMBULATORY_CARE_PROVIDER_SITE_OTHER): Payer: Self-pay

## 2014-03-17 ENCOUNTER — Other Ambulatory Visit: Payer: Self-pay | Admitting: Internal Medicine

## 2014-03-17 DIAGNOSIS — Z1231 Encounter for screening mammogram for malignant neoplasm of breast: Secondary | ICD-10-CM

## 2014-04-17 ENCOUNTER — Ambulatory Visit

## 2014-04-23 ENCOUNTER — Encounter: Payer: Self-pay | Admitting: Obstetrics & Gynecology

## 2014-04-23 ENCOUNTER — Ambulatory Visit (INDEPENDENT_AMBULATORY_CARE_PROVIDER_SITE_OTHER): Admitting: Obstetrics & Gynecology

## 2014-04-23 VITALS — BP 163/99 | HR 70 | Temp 97.5°F | Ht 62.5 in | Wt 260.0 lb

## 2014-04-23 DIAGNOSIS — N951 Menopausal and female climacteric states: Secondary | ICD-10-CM

## 2014-04-23 DIAGNOSIS — Z7689 Persons encountering health services in other specified circumstances: Secondary | ICD-10-CM

## 2014-04-23 MED ORDER — BUPROPION HCL ER (XL) 150 MG PO TB24: 150.0000 mg | ORAL_TABLET | Freq: Every morning | ORAL | Status: DC

## 2014-04-23 MED ORDER — NORETHINDRONE 0.35 MG PO TABS: 1.0000 | ORAL_TABLET | Freq: Every day | ORAL | Status: DC

## 2014-04-24 ENCOUNTER — Ambulatory Visit

## 2014-04-24 NOTE — Progress Notes (Signed)
Patient ID: Kristin Hahn, female   DOB: 01/17/1966, 48 y.o.   MRN: [id number]  Chief Complaint  Patient presents with  . Problem Visit    HPI Kristin Hahn is a 48 y.o. female.  She c/o depressed mood that she attributes to the perimenopause.  She also c/o hot flushes.  HPI  Past Medical History  Diagnosis Date  . Hypertension   . Facet joint disease   . Asthma   . Renal disorder   . Gout     Past Surgical History  Procedure Laterality Date  . Abdominal hysterectomy    . Oophrectomy      right  . Tubal ligation    . Kidney stone removal    . Keloid removal    . Tonsillectomy    . Adenoidectomy      Family History  Problem Relation Age of Onset  . Diabetes Mother   . Hypertension Mother   . Diabetes Father   . Hypertension Father     Social History History  Substance Use Topics  . Smoking status: Never Smoker   . Smokeless tobacco: Not on file  . Alcohol Use: No    Allergies  Allergen Reactions  . Erythromycin Base Nausea And Vomiting    Current Outpatient Prescriptions  Medication Sig Dispense Refill  . albuterol (PROVENTIL HFA;VENTOLIN HFA) 108 (90 BASE) MCG/ACT inhaler Inhale 2 puffs into the lungs every 6 (six) hours as needed. Wheezing and shortness of breath      . amLODipine (NORVASC) 5 MG tablet Take 5 mg by mouth daily.      . cloNIDine (CATAPRES) 0.1 MG tablet Take 1 tablet (0.1 mg total) by mouth 4 (four) times daily.  30 tablet  0  . esomeprazole (NEXIUM) 40 MG capsule Take 40 mg by mouth daily before breakfast.      . nebivolol (BYSTOLIC) 5 MG tablet Take 5 mg by mouth daily.      Marland Kitchen olmesartan-hydrochlorothiazide (BENICAR HCT) 40-25 MG per tablet Take 1 tablet by mouth daily.      Marland Kitchen oxyCODONE (OXYCONTIN) 40 MG 12 hr tablet Take 20 mg by mouth 2 (two) times daily as needed. Pain      . oxycodone (ROXICODONE) 30 MG immediate release tablet Take 30 mg by mouth every 4 (four) hours as needed for pain.      Marland Kitchen buPROPion (WELLBUTRIN XL) 150 MG  24 hr tablet Take 1 tablet (150 mg total) by mouth every morning. For 3 days then may increase to 2 tablets daily if needed.  60 tablet  2  . furosemide (LASIX) 20 MG tablet Take 20 mg by mouth daily.       . norethindrone (MICRONOR,CAMILA,ERRIN) 0.35 MG tablet Take 1 tablet (0.35 mg total) by mouth daily.  1 Package  11   No current facility-administered medications for this visit.    Review of Systems Review of Systems Constitutional: negative for fatigue and weight loss Respiratory: negative for cough and wheezing Cardiovascular: negative for chest pain, fatigue and palpitations Gastrointestinal: negative for abdominal pain and change in bowel habits Genitourinary:negative for abnormal vaginal discharge Integument/breast: negative for nipple discharge Musculoskeletal:negative for myalgias Neurological: negative for gait problems and tremors Behavioral/Psych: negative for abusive relationship; positive for depression Endocrine: negative for temperature intolerance     Blood pressure 163/99, pulse 70, temperature 97.5 F (36.4 C), height 5' 2.5" (1.588 m), weight 117.935 kg (260 lb).  Physical Exam Physical Exam   50% of 15 min  visit spent on counseling and coordination of care.   Data Reviewed Depression screen  Assessment    Perimenopause B/Ps not optimally controlled Depression tool c/w mild depression     Plan    Orders Placed This Encounter  Procedures  . Ambulatory referral to [facility]    Referral Priority:  Routine    Referral Type:  Consultation    Referral Reason:  Specialty Services Required    Requested Specialty:  Family Medicine    Number of Visits Requested:  1   Meds ordered this encounter  Medications  . oxycodone (ROXICODONE) 30 MG immediate release tablet    Sig: Take 30 mg by mouth every 4 (four) hours as needed for pain.  Marland Kitchen buPROPion (WELLBUTRIN XL) 150 MG 24 hr tablet    Sig: Take 1 tablet (150 mg total) by mouth every morning. For 3  days then may increase to 2 tablets daily if needed.    Dispense:  60 tablet    Refill:  2  . norethindrone (MICRONOR,CAMILA,ERRIN) 0.35 MG tablet    Sig: Take 1 tablet (0.35 mg total) by mouth daily.    Dispense:  1 Package    Refill:  11     Follow up as needed.         JACKSON-MOORE,LISA A 04/24/2014, 9:31 PM

## 2014-04-24 NOTE — Patient Instructions (Signed)

## 2014-04-30 ENCOUNTER — Other Ambulatory Visit: Payer: Self-pay | Admitting: Obstetrics

## 2014-04-30 ENCOUNTER — Telehealth: Payer: Self-pay | Admitting: *Deleted

## 2014-04-30 NOTE — Telephone Encounter (Signed)
Patient states she is having SE from the new medications. Patient was given Rx for Wellbutrin XL and Micronor. She states she is having increased fatigue and insomnia.She is also having headache that is new for her. She also c/o anxiety and crying that she did not have before. She is only using 1 tablet/day and she has been taking her medication for 6-7 days. Per Dr Clearance Coots-  She may be on the wrong BP medications and he feels that she should not be having SE this soon fron the Wellbutrin. Call to patient and reviewed her options to continue or stop. Patient decides to continue the Wellbutrin for now and stop the Micronor- so she can tell if her symptoms continue. Told patient I would review her call with Dr Tamela Oddi and let her know what she advised.

## 2014-05-03 NOTE — Telephone Encounter (Signed)
Counsel pt not take Wellbutrin in the evening--will cause insomnia

## 2014-05-06 NOTE — Telephone Encounter (Signed)
Call to patient regarding her medication. Patient states she has stopped both because she wasn't getting any better. Patient would be willing to try something different. She has an appointment on Friday for a f/u BP check.

## 2014-05-08 ENCOUNTER — Emergency Department (HOSPITAL_BASED_OUTPATIENT_CLINIC_OR_DEPARTMENT_OTHER)

## 2014-05-08 ENCOUNTER — Emergency Department (HOSPITAL_BASED_OUTPATIENT_CLINIC_OR_DEPARTMENT_OTHER)
Admission: EM | Admit: 2014-05-08 | Discharge: 2014-05-08 | Disposition: A | Discharge status: Home / Self Care | Attending: Emergency Medicine | Admitting: Emergency Medicine

## 2014-05-08 ENCOUNTER — Encounter (HOSPITAL_BASED_OUTPATIENT_CLINIC_OR_DEPARTMENT_OTHER): Payer: Self-pay | Admitting: Emergency Medicine

## 2014-05-08 DIAGNOSIS — Z8739 Personal history of other diseases of the musculoskeletal system and connective tissue: Secondary | ICD-10-CM | POA: Insufficient documentation

## 2014-05-08 DIAGNOSIS — Z79899 Other long term (current) drug therapy: Secondary | ICD-10-CM | POA: Insufficient documentation

## 2014-05-08 DIAGNOSIS — I10 Essential (primary) hypertension: Secondary | ICD-10-CM | POA: Diagnosis not present

## 2014-05-08 DIAGNOSIS — N200 Calculus of kidney: Secondary | ICD-10-CM | POA: Insufficient documentation

## 2014-05-08 DIAGNOSIS — R100 Acute abdomen: Secondary | ICD-10-CM | POA: Diagnosis present

## 2014-05-08 DIAGNOSIS — J45909 Unspecified asthma, uncomplicated: Secondary | ICD-10-CM | POA: Diagnosis not present

## 2014-05-08 HISTORY — DX: Calculus of kidney: N20.0

## 2014-05-08 LAB — CBC WITH DIFFERENTIAL/PLATELET
Basophils Absolute: 0 10*3/uL (ref 0.0–0.1)
Basophils Relative: 0 % (ref 0–1)
Eosinophils Absolute: 0.1 10*3/uL (ref 0.0–0.7)
Eosinophils Relative: 1 % (ref 0–5)
HCT: 36.1 % (ref 36.0–46.0)
Hemoglobin: 11.6 g/dL — ABNORMAL LOW (ref 12.0–15.0)
Lymphocytes Relative: 42 % (ref 12–46)
Lymphs Abs: 3.1 10*3/uL (ref 0.7–4.0)
MCH: 26.5 pg (ref 26.0–34.0)
MCHC: 32.1 g/dL (ref 30.0–36.0)
MCV: 82.4 fL (ref 78.0–100.0)
Monocytes Absolute: 0.5 10*3/uL (ref 0.1–1.0)
Monocytes Relative: 7 % (ref 3–12)
Neutro Abs: 3.7 10*3/uL (ref 1.7–7.7)
Neutrophils Relative %: 50 % (ref 43–77)
Platelets: 196 10*3/uL (ref 150–400)
RBC: 4.38 MIL/uL (ref 3.87–5.11)
RDW: 15 % (ref 11.5–15.5)
WBC: 7.1 10*3/uL (ref 4.0–10.5)

## 2014-05-08 LAB — BASIC METABOLIC PANEL
Anion gap: 13 (ref 5–15)
BUN: 9 mg/dL (ref 6–23)
CO2: 28 mEq/L (ref 19–32)
Calcium: 9.4 mg/dL (ref 8.4–10.5)
Chloride: 99 mEq/L (ref 96–112)
Creatinine, Ser: 0.7 mg/dL (ref 0.50–1.10)
GFR calc Af Amer: >90 mL/min (ref >90)
GFR calc non Af Amer: >90 mL/min (ref >90)
Glucose, Bld: 93 mg/dL (ref 70–99)
Potassium: 3.4 mEq/L — ABNORMAL LOW (ref 3.7–5.3)
Sodium: 140 mEq/L (ref 137–147)

## 2014-05-08 LAB — URINALYSIS, ROUTINE W REFLEX MICROSCOPIC
Bilirubin Urine: NEGATIVE
Glucose, UA: NEGATIVE mg/dL
Hgb urine dipstick: NEGATIVE
Ketones, ur: NEGATIVE mg/dL
Leukocytes, UA: NEGATIVE
Nitrite: NEGATIVE
Protein, ur: NEGATIVE mg/dL
Specific Gravity, Urine: 1.008 (ref 1.005–1.030)
Urobilinogen, UA: 0.2 mg/dL (ref 0.0–1.0)
pH: 7 (ref 5.0–8.0)

## 2014-05-08 MED ORDER — PHENAZOPYRIDINE HCL 200 MG PO TABS: 200.0000 mg | ORAL_TABLET | Freq: Three times a day (TID) | ORAL | Status: DC | PRN

## 2014-05-08 MED ORDER — MORPHINE SULFATE 4 MG/ML IJ SOLN
4.0000 mg | Freq: Once | INTRAMUSCULAR | Status: AC
  Administered 2014-05-08: 4 mg via INTRAVENOUS
  Filled 2014-05-08: qty 1

## 2014-05-08 MED ORDER — TAMSULOSIN HCL 0.4 MG PO CAPS: 0.4000 mg | ORAL_CAPSULE | Freq: Every day | ORAL | Status: DC

## 2014-05-08 MED ORDER — ONDANSETRON HCL 4 MG/2ML IJ SOLN
4.0000 mg | Freq: Once | INTRAMUSCULAR | Status: AC
  Administered 2014-05-08: 4 mg via INTRAVENOUS
  Filled 2014-05-08: qty 2

## 2014-05-08 MED ORDER — SODIUM CHLORIDE 0.9 % IV BOLUS (SEPSIS)
1000.0000 mL | Freq: Once | INTRAVENOUS | Status: AC
  Administered 2014-05-08: 1000 mL via INTRAVENOUS

## 2014-05-08 NOTE — ED Provider Notes (Signed)
CSN: [id number]     Arrival date & time 05/08/14  1738 History  This chart was scribed for Shon Baton, MD by Jolene Provost, ED Scribe. This patient was seen in room MH07/MH07 and the patient's care was started at 6:32 PM.    Chief Complaint  Patient presents with  . Flank Pain   HPI HPI Comments: LULAR LETSON is a 48 y.o. female with a past hx of kidney stones 16 years ago who presents to the Emergency Department complaining of non-radiating left flank pain that has been intermittent for the last 10 days, and constant since Tuesday. Pt states that the pain is similar to the pain that she felt when she had kidney stones previously. Pt endorses nausea. Pt states that the pain gets worse with moving around. Pt states the pain is a 10/10 right now. Pt denies dysuria, urgency, difficulty urinating or vomiting. [facility] and was prescribed Toradol for her symptoms, which she has been taking without relief. Patient also takes oxycodone/oxycontin at home.  Past Medical History  Diagnosis Date  . Hypertension   . Facet joint disease   . Asthma   . Renal disorder   . Gout   . Kidney stone    Past Surgical History  Procedure Laterality Date  . Abdominal hysterectomy    . Oophrectomy      right  . Tubal ligation    . Kidney stone removal    . Keloid removal    . Tonsillectomy    . Adenoidectomy     Family History  Problem Relation Age of Onset  . Diabetes Mother   . Hypertension Mother   . Diabetes Father   . Hypertension Father    History  Substance Use Topics  . Smoking status: Never Smoker   . Smokeless tobacco: Not on file  . Alcohol Use: No   OB History   Grav Para Term Preterm Abortions TAB SAB Ect Mult Living   2 2 2       2      Review of Systems  Constitutional: Negative for fever.  Respiratory: Negative for chest tightness and shortness of breath.   Cardiovascular: Negative for chest pain.  Gastrointestinal: Positive for nausea.  Negative for vomiting, abdominal pain and diarrhea.  Genitourinary: Positive for flank pain. Negative for dysuria and hematuria.  Neurological: Negative for headaches.  All other systems reviewed and are negative.     Allergies  Erythromycin base  Home Medications   Prior to Admission medications   Medication Sig Start Date End Date Taking? Authorizing Provider  albuterol (PROVENTIL HFA;VENTOLIN HFA) 108 (90 BASE) MCG/ACT inhaler Inhale 2 puffs into the lungs every 6 (six) hours as needed. Wheezing and shortness of breath    Historical Provider, MD  amLODipine (NORVASC) 5 MG tablet Take 5 mg by mouth daily.    Historical Provider, MD  buPROPion (WELLBUTRIN XL) 150 MG 24 hr tablet Take 1 tablet (150 mg total) by mouth every morning. For 3 days then may increase to 2 tablets daily if needed. 04/23/14   Antionette Char, MD  cloNIDine (CATAPRES) 0.1 MG tablet Take 1 tablet (0.1 mg total) by mouth 4 (four) times daily. 04/04/12 04/23/14  Arman Filter, NP  esomeprazole (NEXIUM) 40 MG capsule Take 40 mg by mouth daily before breakfast.    Historical Provider, MD  furosemide (LASIX) 20 MG tablet Take 20 mg by mouth daily.     Historical  Provider, MD  nebivolol (BYSTOLIC) 5 MG tablet Take 5 mg by mouth daily.    Historical Provider, MD  norethindrone (MICRONOR,CAMILA,ERRIN) 0.35 MG tablet Take 1 tablet (0.35 mg total) by mouth daily. 04/23/14   Antionette CharLisa Jackson-Moore, MD  olmesartan-hydrochlorothiazide (BENICAR HCT) 40-25 MG per tablet Take 1 tablet by mouth daily.    Historical Provider, MD  oxyCODONE (OXYCONTIN) 40 MG 12 hr tablet Take 20 mg by mouth 2 (two) times daily as needed. Pain    Historical Provider, MD  oxycodone (ROXICODONE) 30 MG immediate release tablet Take 30 mg by mouth every 4 (four) hours as needed for pain.    Historical Provider, MD  phenazopyridine (PYRIDIUM) 200 MG tablet Take 1 tablet (200 mg total) by mouth 3 (three) times daily as needed for pain. 05/08/14   Shon Batonourtney F Horton,  MD  tamsulosin (FLOMAX) 0.4 MG CAPS capsule Take 1 capsule (0.4 mg total) by mouth daily. 05/08/14   Shon Batonourtney F Horton, MD   BP 122/57  Pulse 62  Temp(Src) 98.5 F (36.9 C) (Oral)  Resp 18  Ht 5\' 2"  (1.575 m)  Wt 265 lb (120.203 kg)  BMI 48.46 kg/m2  SpO2 98% Physical Exam  Nursing note and vitals reviewed. Constitutional: She is oriented to person, place, and time. She appears well-developed and well-nourished.  Morbidly obese  HENT:  Head: Normocephalic and atraumatic.  Mouth/Throat: Oropharynx is clear and moist.  Cardiovascular: Normal rate and regular rhythm.   Pulmonary/Chest: Effort normal. No respiratory distress.  Abdominal: Soft. Bowel sounds are normal. There is no tenderness. There is no rebound and no guarding.  Genitourinary:  No CVA tenderness  Musculoskeletal: She exhibits no edema.  Neurological: She is alert and oriented to person, place, and time.  Skin: Skin is warm and dry.  Psychiatric: She has a normal mood and affect.    ED Course  Procedures (including critical care time) Labs Review Labs Reviewed  CBC WITH DIFFERENTIAL - Abnormal; Notable for the following:    Hemoglobin 11.6 (*)    All other components within normal limits  BASIC METABOLIC PANEL - Abnormal; Notable for the following:    Potassium 3.4 (*)    All other components within normal limits  URINALYSIS, ROUTINE W REFLEX MICROSCOPIC    Imaging Review [facility]  05/08/2014   CLINICAL DATA:  Left flank pain for the past 3 days. History of nephrolithiasis.  EXAM: RENAL/URINARY TRACT ULTRASOUND COMPLETE  COMPARISON:  Abdomen ultrasound dated 06/25/2012.  FINDINGS: Right Kidney:  Length: 13.2 cm. Borderline increased in echogenicity. Interval mild dilatation of the collecting system. This did not improve after voiding.  Left Kidney:  Length: 13.2 cm. Normal echogenicity. Interval mild dilatation of the collecting system. This did not improve after voiding.  Bladder:  Appears normal for degree of  bladder distention.  IMPRESSION: Mild bilateral hydronephrosis.   Electronically Signed   By: Gordan PaymentSteve  Reid M.D.   On: 05/08/2014 20:15   Ct Renal Stone Study  05/08/2014   CLINICAL DATA:  Left flank pain.  History of nephrolithiasis.  EXAM: CT ABDOMEN AND PELVIS WITHOUT CONTRAST  TECHNIQUE: Multidetector CT imaging of the abdomen and pelvis was performed following the standard protocol without IV contrast.  COMPARISON:  Renal ultrasound obtained earlier today. Abdomen ultrasound dated 06/25/2012.  FINDINGS: Mild dilatation of the left renal collecting system and proximal ureter to the level of 2 adjacent calculi in the proximal ureter. The proximal calculus measures 8 mm in length and 8 mm in maximum transverse  diameter on coronal image number 33 and axial image number 52 respectively. The more inferiorly located calculus measures 2 mm in length and 6 mm in maximum transverse diameter on coronal image number 33 and axial image number 53 respectively. The no other left ureteral calculi are seen. No bladder calculi. There are bilateral pelvic phleboliths. No hydronephrosis on the right.  There is an inferior panniculus with laxity of the peritoneum with a broad-based hernia extending inferiorly within the panniculus. The appendix is located in this area and has a normal appearance. There is also right colon and small bowel within the herniated fat in this area. No bowel dilatation, wall thickening or pneumatosis. Also noted is a small umbilical hernia containing fat.  Unremarkable non contrasted appearance of the liver, spleen, pancreas, gallbladder and adrenal glands. Surgically absent uterus. Mildly prominent left ovary containing multiple rounded calcifications. The right ovary is not definitely visualized. There are right ovarian vein phleboliths. There is also a minimally prominent right inferior external iliac lymph node with a short axis diameter of 11 mm on image number 73. There is also a mildly prominent  left common femoral lymph node with a short axis diameter of 12 mm on image number 80. Clear lung bases. Mild to moderate scoliosis and thoracic and lumbar spine degenerative changes.  IMPRESSION: 1. 8 mm and 6 mm proximal left ureteral calculi causing mild left hydronephrosis. 2. Broad-based panniculus hernia containing right colon, appendix and small bowel without obstruction. 3. Small umbilical hernia containing fat. 4. Minimally prominent right external iliac and left common femoral lymph nodes, of doubtful clinical significance.   Electronically Signed   By: Gordan Payment M.D.   On: 05/08/2014 21:30     EKG Interpretation None      MDM   Final diagnoses:  Recurrent kidney stones   Patient presents with persistent left pain pain. Has been treated empirically for kidney stones. She is nontoxic on exam. She is morbidly obese. No significant tenderness or CVA tenderness. Patient was given fluids and pain medicine. Ultrasound was initially obtained showing bilateral hydronephrosis. No stones noted. Patient continues to endorse 9/10 pain. Given history of kidney stones, will obtain CT. CT with evidence of obstructing 8 and 6 mm ureteral calculi. Discussed results with patient. Given the degree of her pain medication she takes at home including oxycodone, OxyContin, and Toradol, I discussed with her that I did not feel I will be able to get her pain under control. I offered the patient admission for pain control and urology evaluation given the size of her stones.  Patient is resistant to admission. She states "at least I know what it is." She is requesting a medication for bladder spasm and I have offered her Flomax. She knows that she needs to followup closely with urology regardless. She will discontinue Toradol at this time in anticipation that she may need a procedure for obstructing stones.  The patient was informed that she may may not pass the stones on her own. Patient stated understanding and was  given strict return precautions.  After history, exam, and medical workup I feel the patient has been appropriately medically screened and is safe for discharge home. Pertinent diagnoses were discussed with the patient. Patient was given return precautions.  I personally performed the services described in this documentation, which was scribed in my presence. The recorded information has been reviewed and is accurate.    Shon Baton, MD 05/09/14 [id number]

## 2014-05-08 NOTE — ED Notes (Signed)
C/o left flank x 2 days-was seen at [facility]-UA done-toradol injection and rx toradol and dx with kidney stone-c/o cont'd pain

## 2014-05-08 NOTE — ED Notes (Signed)
Patient transported to CT 

## 2014-05-08 NOTE — Discharge Instructions (Signed)
You have two moderate size (6mm and 8mm) obstructing stones.  You need to follow-up immediately with urology.  If your pain becomes unbearable at home, you may need admission for pain control.  These stones may not pass on their own.  Make sure to stay well hydrated.  Kidney Stones Kidney stones (urolithiasis) are deposits that form inside your kidneys. The intense pain is caused by the stone moving through the urinary tract. When the stone moves, the ureter goes into spasm around the stone. The stone is usually passed in the urine.  CAUSES   A disorder that makes certain neck glands produce too much parathyroid hormone (primary hyperparathyroidism).  A buildup of uric acid crystals, similar to gout in your joints.  Narrowing (stricture) of the ureter.  A kidney obstruction present at birth (congenital obstruction).  Previous surgery on the kidney or ureters.  Numerous kidney infections. SYMPTOMS   Feeling sick to your stomach (nauseous).  Throwing up (vomiting).  Blood in the urine (hematuria).  Pain that usually spreads (radiates) to the groin.  Frequency or urgency of urination. DIAGNOSIS   Taking a history and physical exam.  Blood or urine tests.  CT scan.  Occasionally, an examination of the inside of the urinary bladder (cystoscopy) is performed. TREATMENT   Observation.  Increasing your fluid intake.  Extracorporeal shock wave lithotripsy--This is a noninvasive procedure that uses shock waves to break up kidney stones.  Surgery may be needed if you have severe pain or persistent obstruction. There are various surgical procedures. Most of the procedures are performed with the use of small instruments. Only small incisions are needed to accommodate these instruments, so recovery time is minimized. The size, location, and chemical composition are all important variables that will determine the proper choice of action for you. Talk to your health care provider to  better understand your situation so that you will minimize the risk of injury to yourself and your kidney.  HOME CARE INSTRUCTIONS   Drink enough water and fluids to keep your urine clear or pale yellow. This will help you to pass the stone or stone fragments.  Strain all urine through the provided strainer. Keep all particulate matter and stones for your health care provider to see. The stone causing the pain may be as small as a grain of salt. It is very important to use the strainer each and every time you pass your urine. The collection of your stone will allow your health care provider to analyze it and verify that a stone has actually passed. The stone analysis will often identify what you can do to reduce the incidence of recurrences.  Only take over-the-counter or prescription medicines for pain, discomfort, or fever as directed by your health care provider.  Make a follow-up appointment with your health care provider as directed.  Get follow-up X-rays if required. The absence of pain does not always mean that the stone has passed. It may have only stopped moving. If the urine remains completely obstructed, it can cause loss of kidney function or even complete destruction of the kidney. It is your responsibility to make sure X-rays and follow-ups are completed. Ultrasounds of the kidney can show blockages and the status of the kidney. Ultrasounds are not associated with any radiation and can be performed easily in a matter of minutes. SEEK MEDICAL CARE IF:  You experience pain that is progressive and unresponsive to any pain medicine you have been prescribed. SEEK IMMEDIATE MEDICAL CARE IF:  Pain cannot be controlled with the prescribed medicine.  You have a fever or shaking chills.  The severity or intensity of pain increases over 18 hours and is not relieved by pain medicine.  You develop a new onset of abdominal pain.  You feel faint or pass out.  You are unable to  urinate. MAKE SURE YOU:   Understand these instructions.  Will watch your condition.  Will get help right away if you are not doing well or get worse. Document Released: 07/25/2005 Document Revised: 03/27/2013 Document Reviewed: 12/26/2012 [facility] Patient Information 2015 [place], [place]. This information is not intended to replace advice given to you by your health care provider. Make sure you discuss any questions you have with your health care provider.

## 2014-05-09 ENCOUNTER — Ambulatory Visit (INDEPENDENT_AMBULATORY_CARE_PROVIDER_SITE_OTHER): Admitting: Internal Medicine

## 2014-05-09 ENCOUNTER — Encounter: Payer: Self-pay | Admitting: Internal Medicine

## 2014-05-09 VITALS — BP 164/95 | HR 67 | Ht 62.5 in | Wt 264.7 lb

## 2014-05-09 DIAGNOSIS — I10 Essential (primary) hypertension: Secondary | ICD-10-CM

## 2014-05-09 DIAGNOSIS — N200 Calculus of kidney: Secondary | ICD-10-CM | POA: Insufficient documentation

## 2014-05-09 DIAGNOSIS — M1000 Idiopathic gout, unspecified site: Secondary | ICD-10-CM

## 2014-05-09 DIAGNOSIS — M109 Gout, unspecified: Secondary | ICD-10-CM | POA: Insufficient documentation

## 2014-05-09 NOTE — Progress Notes (Signed)
OFFICE NOTE  Chief Complaint:  High blood pressure, kidney stones  Primary Care Physician: Beryle Beams, MD  HPI:  AJAH VANHOOSE is a 48 year old female who I had seen in 2013, with a history of hypertension, morbid obesity, gout, and intolerance to medications. She had been on Lasix and developed hypokalemia, was put on clonidine and had fatigue, also took Calan which caused fatigue, and Azor which worked well except caused her swelling on a 10/40 mg dose. She was also on Bystolic which was increased to 10 mg, however, she had some increase in her asthma symptoms, and it was recommended that she decrease her Bystolic to 5 mg at her last visit. In addition, we started her on hydralazine 25 mg t.i.d.; however, she said this medication made her nauseated and she had to stop taking it.    Recently she developed symptomatic kidney stones and was seen in the emergency room. She is scheduled to see urologist later today. Her blood pressures been much higher than normal up into the 643 systolic range. She was taking clonidine 4 times a day, however she said this made her very tired and now only takes one pill at night. She has never had a workup for secondary hypertension.  PMHx:  Past Medical History  Diagnosis Date  . Hypertension   . Facet joint disease   . Asthma   . Renal disorder   . Gout   . Kidney stone     Past Surgical History  Procedure Laterality Date  . Abdominal hysterectomy    . Oophrectomy      right  . Tubal ligation    . Kidney stone removal    . Keloid removal    . Tonsillectomy    . Adenoidectomy      FAMHx:  Family History  Problem Relation Age of Onset  . Diabetes Mother   . Hypertension Mother   . Diabetes Father   . Hypertension Father     SOCHx:   reports that she has never smoked. She does not have any smokeless tobacco history on file. She reports that she does not drink alcohol or use illicit drugs.  ALLERGIES:  Allergies  Allergen  Reactions  . Erythromycin Base Nausea And Vomiting    ROS: A comprehensive review of systems was negative except for: Genitourinary: positive for kidney stones  HOME MEDS: Current Outpatient Prescriptions  Medication Sig Dispense Refill  . albuterol (PROVENTIL HFA;VENTOLIN HFA) 108 (90 BASE) MCG/ACT inhaler Inhale 2 puffs into the lungs every 6 (six) hours as needed. Wheezing and shortness of breath      . amLODipine (NORVASC) 5 MG tablet Take 5 mg by mouth daily.      Marland Kitchen buPROPion (WELLBUTRIN XL) 150 MG 24 hr tablet Take 1 tablet (150 mg total) by mouth every morning. For 3 days then may increase to 2 tablets daily if needed.  60 tablet  2  . esomeprazole (NEXIUM) 40 MG capsule Take 40 mg by mouth daily before breakfast.      . furosemide (LASIX) 20 MG tablet Take 20 mg by mouth daily.       Marland Kitchen ketorolac (TORADOL) 10 MG tablet Take 10 mg by mouth every 6 (six) hours as needed.       . nebivolol (BYSTOLIC) 5 MG tablet Take 5 mg by mouth daily.      . norethindrone (MICRONOR,CAMILA,ERRIN) 0.35 MG tablet Take 1 tablet (0.35 mg total) by mouth daily.  1 Package  11  . olmesartan-hydrochlorothiazide (BENICAR HCT) 40-25 MG per tablet Take 1 tablet by mouth daily.      Marland Kitchen oxyCODONE (OXYCONTIN) 40 MG 12 hr tablet Take 20 mg by mouth 2 (two) times daily as needed. Pain      . oxycodone (ROXICODONE) 30 MG immediate release tablet Take 15 mg by mouth. Q4-6H PRN      . phenazopyridine (PYRIDIUM) 200 MG tablet Take 1 tablet (200 mg total) by mouth 3 (three) times daily as needed for pain.  20 tablet  0  . tamsulosin (FLOMAX) 0.4 MG CAPS capsule Take 1 capsule (0.4 mg total) by mouth daily.  30 capsule  0  . cloNIDine (CATAPRES) 0.1 MG tablet Take 0.1 mg by mouth daily.       No current facility-administered medications for this visit.    LABS/IMAGING: Results for orders placed during the hospital encounter of 05/08/14 (from the past 48 hour(s))  URINALYSIS, ROUTINE W REFLEX MICROSCOPIC     Status:  None   Collection Time    05/08/14  5:55 PM      Result Value Ref Range   Color, Urine YELLOW  YELLOW   APPearance CLEAR  CLEAR   Specific Gravity, Urine 1.008  1.005 - 1.030   pH 7.0  5.0 - 8.0   Glucose, UA NEGATIVE  NEGATIVE mg/dL   Hgb urine dipstick NEGATIVE  NEGATIVE   Bilirubin Urine NEGATIVE  NEGATIVE   Ketones, ur NEGATIVE  NEGATIVE mg/dL   Protein, ur NEGATIVE  NEGATIVE mg/dL   Urobilinogen, UA 0.2  0.0 - 1.0 mg/dL   Nitrite NEGATIVE  NEGATIVE   Leukocytes, UA NEGATIVE  NEGATIVE   Comment: MICROSCOPIC NOT DONE ON URINES WITH NEGATIVE PROTEIN, BLOOD, LEUKOCYTES, NITRITE, OR GLUCOSE <1000 mg/dL.  CBC WITH DIFFERENTIAL     Status: Abnormal   Collection Time    05/08/14  7:00 PM      Result Value Ref Range   WBC 7.1  4.0 - 10.5 K/uL   Comment: WHITE COUNT CONFIRMED ON SMEAR   RBC 4.38  3.87 - 5.11 MIL/uL   Hemoglobin 11.6 (*) 12.0 - 15.0 g/dL   HCT 36.1  36.0 - 46.0 %   MCV 82.4  78.0 - 100.0 fL   MCH 26.5  26.0 - 34.0 pg   MCHC 32.1  30.0 - 36.0 g/dL   RDW 15.0  11.5 - 15.5 %   Platelets 196  150 - 400 K/uL   Comment: PLATELET COUNT CONFIRMED BY SMEAR     LARGE PLATELETS PRESENT   Neutrophils Relative % 50  43 - 77 %   Neutro Abs 3.7  1.7 - 7.7 K/uL   Lymphocytes Relative 42  12 - 46 %   Lymphs Abs 3.1  0.7 - 4.0 K/uL   Monocytes Relative 7  3 - 12 %   Monocytes Absolute 0.5  0.1 - 1.0 K/uL   Eosinophils Relative 1  0 - 5 %   Eosinophils Absolute 0.1  0.0 - 0.7 K/uL   Basophils Relative 0  0 - 1 %   Basophils Absolute 0.0  0.0 - 0.1 K/uL  BASIC METABOLIC PANEL     Status: Abnormal   Collection Time    05/08/14  7:00 PM      Result Value Ref Range   Sodium 140  137 - 147 mEq/L   Potassium 3.4 (*) 3.7 - 5.3 mEq/L   Chloride 99  96 - 112 mEq/L   CO2 28  19 - 32 mEq/L   Glucose, Bld 93  70 - 99 mg/dL   BUN 9  6 - 23 mg/dL   Creatinine, Ser 0.70  0.50 - 1.10 mg/dL   Calcium 9.4  8.4 - 10.5 mg/dL   GFR calc non Af Amer >90  >90 mL/min   GFR calc Af Amer >90   >90 mL/min   Comment: (NOTE)     The eGFR has been calculated using the CKD EPI equation.     This calculation has not been validated in all clinical situations.     eGFR's persistently <90 mL/min signify possible Chronic Kidney     Disease.   Anion gap 13  5 - 15   US Renal  05/08/2014   CLINICAL DATA:  Left flank pain for the past 3 days. History of nephrolithiasis.  EXAM: RENAL/URINARY TRACT ULTRASOUND COMPLETE  COMPARISON:  Abdomen ultrasound dated 06/25/2012.  FINDINGS: Right Kidney:  Length: 13.2 cm. Borderline increased in echogenicity. Interval mild dilatation of the collecting system. This did not improve after voiding.  Left Kidney:  Length: 13.2 cm. Normal echogenicity. Interval mild dilatation of the collecting system. This did not improve after voiding.  Bladder:  Appears normal for degree of bladder distention.  IMPRESSION: Mild bilateral hydronephrosis.   Electronically Signed   By: Enrique Sack M.D.   On: 05/08/2014 20:15   Ct Renal Stone Study  05/08/2014   CLINICAL DATA:  Left flank pain.  History of nephrolithiasis.  EXAM: CT ABDOMEN AND PELVIS WITHOUT CONTRAST  TECHNIQUE: Multidetector CT imaging of the abdomen and pelvis was performed following the standard protocol without IV contrast.  COMPARISON:  Renal ultrasound obtained earlier today. Abdomen ultrasound dated 06/25/2012.  FINDINGS: Mild dilatation of the left renal collecting system and proximal ureter to the level of 2 adjacent calculi in the proximal ureter. The proximal calculus measures 8 mm in length and 8 mm in maximum transverse diameter on coronal image number 33 and axial image number 52 respectively. The more inferiorly located calculus measures 2 mm in length and 6 mm in maximum transverse diameter on coronal image number 33 and axial image number 53 respectively. The no other left ureteral calculi are seen. No bladder calculi. There are bilateral pelvic phleboliths. No hydronephrosis on the right.  There is an  inferior panniculus with laxity of the peritoneum with a broad-based hernia extending inferiorly within the panniculus. The appendix is located in this area and has a normal appearance. There is also right colon and small bowel within the herniated fat in this area. No bowel dilatation, wall thickening or pneumatosis. Also noted is a small umbilical hernia containing fat.  Unremarkable non contrasted appearance of the liver, spleen, pancreas, gallbladder and adrenal glands. Surgically absent uterus. Mildly prominent left ovary containing multiple rounded calcifications. The right ovary is not definitely visualized. There are right ovarian vein phleboliths. There is also a minimally prominent right inferior external iliac lymph node with a short axis diameter of 11 mm on image number 73. There is also a mildly prominent left common femoral lymph node with a short axis diameter of 12 mm on image number 80. Clear lung bases. Mild to moderate scoliosis and thoracic and lumbar spine degenerative changes.  IMPRESSION: 1. 8 mm and 6 mm proximal left ureteral calculi causing mild left hydronephrosis. 2. Broad-based panniculus hernia containing right colon, appendix and small bowel without obstruction. 3. Small umbilical hernia containing fat. 4. Minimally prominent right external iliac and left common  femoral lymph nodes, of doubtful clinical significance.   Electronically Signed   By: Enrique Sack M.D.   On: 05/08/2014 21:30    VITALS: BP 164/95  Pulse 67  Ht 5' 2.5" (1.588 m)  Wt 264 lb 11.2 oz (120.067 kg)  BMI 47.61 kg/m2  EXAM: General appearance: alert and no distress Neck: no carotid bruit and no JVD Lungs: clear to auscultation bilaterally Heart: regular rate and rhythm, S1, S2 normal, no murmur, click, rub or gallop Abdomen: soft, non-tender; bowel sounds normal; no masses,  no organomegaly and morbidly obese Extremities: extremities normal, atraumatic, no cyanosis or edema Pulses: 2+ and  symmetric Skin: Skin color, texture, turgor normal. No rashes or lesions Neurologic: Grossly normal Psych: Pleasant  EKG: Normal sinus rhythm at 67  ASSESSMENT: 1. Hypertension-uncontrolled 2. Kidney stones 3. Morbid obesity 4. Gout  PLAN: 1.   Mrs. Brissette is here for followup of uncontrolled hypertension. Somewhat which now may be due to pain from her kidney stones. She is scheduled to see urologist this afternoon. I would like for her to have that issue dealt with in her pain better controlled before making major changes in her blood pressure medicine. She is ready maxed out on a number of medications and will not go on to a higher dose of amlodipine due to swelling. I'm concerned that she may have a secondary hypertension as well and has had hypokalemia in the past. I like to obtain lab work including screening for hyperaldosteronism with a plasma renin/aldosterone ratio and serum catecholamines.  Pixie Casino, MD, [facility] Attending Cardiologist [facility]  HILTY,Kenneth C 05/09/2014, 5:05 PM

## 2014-05-09 NOTE — Patient Instructions (Addendum)
[facility]  Your physician recommends that you schedule a follow-up appointment in: 4 weeks with Dr. Rennis GoldenHilty.

## 2014-05-11 NOTE — Telephone Encounter (Signed)
Offer appointment.

## 2014-05-12 ENCOUNTER — Telehealth: Payer: Self-pay | Admitting: Internal Medicine

## 2014-05-12 NOTE — Telephone Encounter (Signed)
Not sure what PCP I recommended - Probably Dr. Maryelizabeth RowanElizabeth Dewey. I'm a little concerned about lasix because she has had low potassium before and it may make that worse. I was thinking about putting her on aldactone, but I would like to see her bloodwork first. Can she get that drawn? If it is abnormal, then I would put her on aldactone.  Thanks.  Dr. HRexene Edison

## 2014-05-12 NOTE — Telephone Encounter (Signed)
Returned call to patient she stated she just saw Dr.Hilty 05/09/14.Stated he wanted to wait until he found out if she had kidney stones before he prescribed lasix.Stated her urologist told her she does not have kidney stones.He will send report to Dr.Hilty.Stated she is having a lot of swelling in ankles and feet.Stated she wants to know if Dr.Hilty will prescribe lasix.Also wants to know name of PCP Dr.Hilty recommended.Message sent to Dr.Hilty for advice.

## 2014-05-12 NOTE — Telephone Encounter (Signed)
Returned call to patient Dr.Hilty advised PCP Dr.Elizabeth Duanne Guessewey.Advised needs to get results of lab work before he prescribes diuretic.Stated she will have lab work tomorrow 05/13/14.

## 2014-05-12 NOTE — Telephone Encounter (Signed)
Pt called in stating that she went to the Urologist after she saw Dr. Rennis GoldenHilty and she said that the test from the urologist said that she DID NOT have kidney stones. She wold like for Dr. Rennis GoldenHilty to call in the prescription for Lasix since she is still retaining fluid in her lower extremities.  Thanks

## 2014-05-19 NOTE — Telephone Encounter (Signed)
Forward to DeltaBrenda to schedule an appointment

## 2014-05-21 NOTE — Telephone Encounter (Signed)
10.14.2015 - patient has a follow up appt sscheduled with Dr. Tamela OddiJAckson-Moore 12.16.2015. brm

## 2014-05-26 NOTE — Telephone Encounter (Signed)
Called patient. She states she had lab work done recently and we should be getting results soon. She will be notified of results/medication changes as directed by MD based on lab work. Patient voiced understanding

## 2014-05-27 LAB — CATECHOLAMINES, FRACTIONATED, PLASMA
Catecholamines, Total: 958 pg/mL
Norepinephrine: 958 pg/mL

## 2014-05-29 LAB — ALDOSTERONE + RENIN ACTIVITY W/ RATIO
ALDO / PRA Ratio: 80 Ratio — ABNORMAL HIGH (ref 0.9–28.9)
Aldosterone: 8 ng/dL
PRA LC/MS/MS: 0.1 ng/mL/h — ABNORMAL LOW (ref 0.25–5.82)

## 2014-06-03 ENCOUNTER — Other Ambulatory Visit: Payer: Self-pay | Admitting: *Deleted

## 2014-06-03 ENCOUNTER — Telehealth: Payer: Self-pay | Admitting: Internal Medicine

## 2014-06-03 DIAGNOSIS — Z79899 Other long term (current) drug therapy: Secondary | ICD-10-CM

## 2014-06-03 MED ORDER — SPIRONOLACTONE 50 MG PO TABS: 50.0000 mg | ORAL_TABLET | Freq: Every day | ORAL | Status: DC

## 2014-06-03 NOTE — Telephone Encounter (Signed)
Patient wanted to provide update that she would be taking wellbutrin - she had taken for 7-8 days before and decided to stop and will now take again. She wanted to make sure her medications would not interact. There was no warning when spironolactone was ordered.

## 2014-06-03 NOTE — Telephone Encounter (Signed)
Kristin Hahn is stating that she spoke with the nurse earlier and wanted to tell you about the other medication that she is taking .Marland Kitchen. Please call    Thanks

## 2014-06-09 ENCOUNTER — Encounter: Payer: Self-pay | Admitting: Internal Medicine

## 2014-06-14 LAB — BASIC METABOLIC PANEL
BUN: 13 mg/dL (ref 6–23)
CO2: 27 mEq/L (ref 19–32)
Calcium: 9.5 mg/dL (ref 8.4–10.5)
Chloride: 103 mEq/L (ref 96–112)
Creat: 0.81 mg/dL (ref 0.50–1.10)
Glucose, Bld: 123 mg/dL — ABNORMAL HIGH (ref 70–99)
Potassium: 4.2 mEq/L (ref 3.5–5.3)
Sodium: 140 mEq/L (ref 135–145)

## 2014-06-17 ENCOUNTER — Ambulatory Visit: Admitting: Internal Medicine

## 2014-06-19 ENCOUNTER — Telehealth: Payer: Self-pay | Admitting: Internal Medicine

## 2014-06-19 NOTE — Telephone Encounter (Signed)
Notified the pt that [facility] nurse left a message on her phone yesterday to inform her of her stable lab results.  Briefly went over the results with the pt on the phone. Pt verbalized understanding and gracious for the call back.

## 2014-06-19 NOTE — Telephone Encounter (Signed)
Kristin Hahn is calling to get her lab results . Please call her at the work number   Thanks

## 2014-07-23 ENCOUNTER — Ambulatory Visit: Admitting: Obstetrics & Gynecology

## 2014-07-24 ENCOUNTER — Ambulatory Visit: Admitting: Obstetrics & Gynecology

## 2014-07-25 ENCOUNTER — Telehealth: Payer: Self-pay | Admitting: *Deleted

## 2014-07-25 ENCOUNTER — Other Ambulatory Visit: Payer: Self-pay | Admitting: Obstetrics & Gynecology

## 2014-07-25 NOTE — Telephone Encounter (Signed)
Patient states she is currently on Wellbutrin 300 mg daily and has been for about 2 months. Patient states she doesn't like the side effects and would like to ween off the medication.  Per Dr. Tamela OddiJackson-Moore patient to  Take Wellbutrin 150 mg daily for 3 days and then 150 mg every other day for 3 days and then to stop medication.  Patient advised of recommendation and verbalized understanding.

## 2014-07-30 ENCOUNTER — Ambulatory Visit (INDEPENDENT_AMBULATORY_CARE_PROVIDER_SITE_OTHER): Admitting: Obstetrics & Gynecology

## 2014-07-30 ENCOUNTER — Encounter: Payer: Self-pay | Admitting: Obstetrics & Gynecology

## 2014-07-30 VITALS — BP 146/103 | HR 95 | Temp 97.4°F | Ht 62.0 in | Wt 260.0 lb

## 2014-07-30 DIAGNOSIS — N951 Menopausal and female climacteric states: Secondary | ICD-10-CM

## 2014-08-01 DIAGNOSIS — N951 Menopausal and female climacteric states: Secondary | ICD-10-CM | POA: Insufficient documentation

## 2014-08-01 NOTE — Progress Notes (Signed)
Patient ID: Kristin Boardsenny H Eide, female   DOB: [birth year], 48 y.o.   MRN: [id number]  Chief Complaint  Patient presents with  . Follow-up    HPI Kristin Hahn is a 48 y.o. female.  Less hot flushes with Wellbutrin but more emotional lability.  HPI  Past Medical History  Diagnosis Date  . Hypertension   . Facet joint disease   . Asthma   . Renal disorder   . Gout   . Kidney stone     Past Surgical History  Procedure Laterality Date  . Abdominal hysterectomy    . Oophrectomy      right  . Tubal ligation    . Kidney stone removal    . Keloid removal    . Tonsillectomy    . Adenoidectomy      Family History  Problem Relation Age of Onset  . Diabetes Mother   . Hypertension Mother   . Diabetes Father   . Hypertension Father     Social History History  Substance Use Topics  . Smoking status: Never Smoker   . Smokeless tobacco: Not on file  . Alcohol Use: No    Allergies  Allergen Reactions  . Erythromycin Base Nausea And Vomiting    Current Outpatient Prescriptions  Medication Sig Dispense Refill  . albuterol (PROVENTIL HFA;VENTOLIN HFA) 108 (90 BASE) MCG/ACT inhaler Inhale 2 puffs into the lungs every 6 (six) hours as needed. Wheezing and shortness of breath    . amLODipine (NORVASC) 5 MG tablet Take 5 mg by mouth daily.    Marland Kitchen. buPROPion (WELLBUTRIN XL) 150 MG 24 hr tablet Take 1 tablet (150 mg total) by mouth every morning. For 3 days then may increase to 2 tablets daily if needed. 60 tablet 2  . cloNIDine (CATAPRES) 0.1 MG tablet Take 0.1 mg by mouth daily.    . nebivolol (BYSTOLIC) 5 MG tablet Take 5 mg by mouth daily.    Marland Kitchen. olmesartan-hydrochlorothiazide (BENICAR HCT) 40-25 MG per tablet Take 1 tablet by mouth daily.    Marland Kitchen. oxyCODONE (OXYCONTIN) 40 MG 12 hr tablet Take 20 mg by mouth 2 (two) times daily as needed. Pain    . oxycodone (ROXICODONE) 30 MG immediate release tablet Take 15 mg by mouth. Q4-6H PRN    . spironolactone (ALDACTONE) 50 MG tablet Take 1  tablet (50 mg total) by mouth daily. 30 tablet 6  . norethindrone (MICRONOR,CAMILA,ERRIN) 0.35 MG tablet Take 1 tablet (0.35 mg total) by mouth daily. (Patient not taking: Reported on 07/30/2014) 1 Package 11   No current facility-administered medications for this visit.    Review of Systems Review of Systems Constitutional: negative for fatigue and weight loss Respiratory: negative for cough and wheezing Cardiovascular: negative for chest pain, fatigue and palpitations Gastrointestinal: negative for abdominal pain and change in bowel habits Genitourinary:negative for abnormal vaginal discharge Integument/breast: negative for nipple discharge Musculoskeletal:negative for myalgias Neurological: negative for gait problems and tremors Behavioral/Psych: negative for abusive relationship, depression Endocrine: negative for temperature intolerance     Blood pressure 146/103, pulse 95, temperature 97.4 F (36.3 C), height 5\' 2"  (1.575 m), weight 117.935 kg (260 lb).  Physical Exam Physical Exam   50% of 15 min visit spent on counseling and coordination of care.   Data Reviewed None  Assessment    Perimenopausal symptoms--improvement w/Wellbutrin Depression tool score c/w mild depression    Plan     Follow up as needed.  Or in 6 mths.  JACKSON-MOORE,LISA A 08/01/2014, 3:50 PM

## 2014-08-04 ENCOUNTER — Encounter: Payer: Self-pay | Admitting: *Deleted

## 2014-08-05 ENCOUNTER — Encounter: Payer: Self-pay | Admitting: Obstetrics & Gynecology

## 2015-02-07 ENCOUNTER — Emergency Department (INDEPENDENT_AMBULATORY_CARE_PROVIDER_SITE_OTHER)
Admission: EM | Admit: 2015-02-07 | Discharge: 2015-02-07 | Disposition: A | Discharge status: Home / Self Care | Source: Home / Self Care | Attending: Emergency Medicine | Admitting: Emergency Medicine

## 2015-02-07 ENCOUNTER — Encounter (HOSPITAL_COMMUNITY): Payer: Self-pay | Admitting: Emergency Medicine

## 2015-02-07 DIAGNOSIS — M1990 Unspecified osteoarthritis, unspecified site: Secondary | ICD-10-CM

## 2015-02-07 DIAGNOSIS — M10071 Idiopathic gout, right ankle and foot: Secondary | ICD-10-CM | POA: Diagnosis not present

## 2015-02-07 DIAGNOSIS — M25562 Pain in left knee: Secondary | ICD-10-CM

## 2015-02-07 MED ORDER — PREDNISONE 50 MG PO TABS: ORAL_TABLET | ORAL | Status: DC

## 2015-02-07 MED ORDER — IBUPROFEN 800 MG PO TABS: 800.0000 mg | ORAL_TABLET | Freq: Three times a day (TID) | ORAL | Status: DC | PRN

## 2015-02-07 NOTE — ED Notes (Signed)
C/o left hip pain onset today when she woke up Denies inj/trauma or any strenuous activity Hx of bursitis, arhritis, Gout Also reports gout flare up on right greater toe onset 2 weeks Was given Allopurinol  Slow, steady gait Alert, no signs of acute distress.

## 2015-02-07 NOTE — ED Provider Notes (Signed)
CSN: [id number]     Arrival date & time 02/07/15  1650 History   First MD Initiated Contact with Patient 02/07/15 1852     Chief Complaint  Patient presents with  . Hip Pain   (Consider location/radiation/quality/duration/timing/severity/associated sxs/prior Treatment) HPI  She is a 49 year old woman here for evaluation of left knee pain and other aches. She states in the last day her chronic arthritis and bursitis pain has worsened. She reports pain in her shoulders, back, hips, and knees. The worst is in the left knee. It is quite painful with weightbearing. She is able to walk, but is hobbling around. She denies any injury or change in activity. She also reports pain and swelling of her right great toe. This was diagnosed as gout several weeks ago and she was started on allopurinol.  Past Medical History  Diagnosis Date  . Hypertension   . Facet joint disease   . Asthma   . Renal disorder   . Gout   . Kidney stone    Past Surgical History  Procedure Laterality Date  . Abdominal hysterectomy    . Oophrectomy      right  . Tubal ligation    . Kidney stone removal    . Keloid removal    . Tonsillectomy    . Adenoidectomy     Family History  Problem Relation Age of Onset  . Diabetes Mother   . Hypertension Mother   . Diabetes Father   . Hypertension Father    History  Substance Use Topics  . Smoking status: Never Smoker   . Smokeless tobacco: Not on file  . Alcohol Use: No   OB History    Gravida Para Term Preterm AB TAB SAB Ectopic Multiple Living   Review of Systems As in history of present illness Allergies  Erythromycin base  Home Medications   Prior to Admission medications   Medication Sig Start Date End Date Taking? Authorizing Provider  amLODipine (NORVASC) 5 MG tablet Take 5 mg by mouth daily.   Yes Historical Provider, MD  nebivolol (BYSTOLIC) 5 MG tablet Take 5 mg by mouth daily.   Yes Historical Provider, MD   olmesartan-hydrochlorothiazide (BENICAR HCT) 40-25 MG per tablet Take 1 tablet by mouth daily.   Yes Historical Provider, MD  albuterol (PROVENTIL HFA;VENTOLIN HFA) 108 (90 BASE) MCG/ACT inhaler Inhale 2 puffs into the lungs every 6 (six) hours as needed. Wheezing and shortness of breath    Historical Provider, MD  buPROPion (WELLBUTRIN XL) 150 MG 24 hr tablet TAKE 1 TABLET BY MOUTH EVERY MORNING FOR 3 DAYS MAY INCREASE TO 2 TABS IF NEEDED 08/02/14   Antionette Char, MD  cloNIDine (CATAPRES) 0.1 MG tablet Take 0.1 mg by mouth daily. 04/04/12 07/30/14  Earley Favor, NP  ibuprofen (ADVIL,MOTRIN) 800 MG tablet Take 1 tablet (800 mg total) by mouth every 8 (eight) hours as needed for moderate pain. 02/07/15   Charm Rings, MD  oxyCODONE (OXYCONTIN) 40 MG 12 hr tablet Take 20 mg by mouth 2 (two) times daily as needed. Pain    Historical Provider, MD  oxycodone (ROXICODONE) 30 MG immediate release tablet Take 15 mg by mouth. Q4-6H PRN    Historical Provider, MD  predniSONE (DELTASONE) 50 MG tablet Take 1 pill daily for 5 days. 02/07/15   Charm Rings, MD  spironolactone (ALDACTONE) 50 MG tablet Take 1 tablet (50 mg total) by  mouth daily. 06/03/14   Chrystie NoseKenneth C Hilty, MD   BP 166/86 mmHg  Pulse 70  Temp(Src) 97.1 F (36.2 C) (Oral)  Resp 22  SpO2 99% Physical Exam  Constitutional: She is oriented to person, place, and time. She appears well-developed and well-nourished. No distress.  Morbidly obese  Cardiovascular: Normal rate.   Pulmonary/Chest: Effort normal.  Musculoskeletal:  Left knee: No erythema or edema. No joint effusion. No point tenderness. Positive McMurray's. No joint laxity. Right great toe: There is swelling and erythema. She is tender to light touch and passive range of motion of the MTP joint.  Neurological: She is alert and oriented to person, place, and time.    ED Course  Procedures (including critical care time) Labs Review Labs Reviewed - No data to display  Imaging  Review No results found.   MDM   1. Left knee pain   2. Arthritis   3. Acute idiopathic gout of right foot    Given diffuse nature of her symptoms, will treat with 5 days of prednisone. Ibuprofen 800 mg every 8 hours as needed for pain. She will follow-up with her PCP in the next week.    Charm RingsErin J Honig, MD 02/07/15 Ernestina [id number]

## 2015-02-07 NOTE — Discharge Instructions (Signed)
Your arthritis is flared up. You may have a small tear in the cartilage of your left knee. You also likely have gout in your right big toe. Take prednisone 1 pill daily for the next 5 days. Take ibuprofen 800 mg every 8 hours as needed for pain. You should see improvement in the next 24-36 hours. Walking and exercising in a pool will be easier on your joints. Follow-up with your PCP in the next week.

## 2015-06-18 ENCOUNTER — Ambulatory Visit (INDEPENDENT_AMBULATORY_CARE_PROVIDER_SITE_OTHER): Admitting: Certified Nurse Midwife

## 2015-06-18 ENCOUNTER — Encounter: Payer: Self-pay | Admitting: Certified Nurse Midwife

## 2015-06-18 VITALS — BP 142/83 | HR 74 | Wt 268.0 lb

## 2015-06-18 DIAGNOSIS — J4531 Mild persistent asthma with (acute) exacerbation: Secondary | ICD-10-CM

## 2015-06-18 DIAGNOSIS — N959 Unspecified menopausal and perimenopausal disorder: Secondary | ICD-10-CM

## 2015-06-18 DIAGNOSIS — K640 First degree hemorrhoids: Secondary | ICD-10-CM | POA: Diagnosis not present

## 2015-06-18 MED ORDER — FLUTICASONE PROPIONATE 50 MCG/ACT NA SUSP: 2.0000 | Freq: Every day | NASAL | Status: DC

## 2015-06-18 MED ORDER — ESTRADIOL 1 MG PO TABS: 1.0000 mg | ORAL_TABLET | Freq: Every day | ORAL | Status: DC

## 2015-06-18 MED ORDER — HYDROCORTISONE ACETATE 25 MG RE SUPP: 25.0000 mg | Freq: Two times a day (BID) | RECTAL | Status: DC

## 2015-06-18 MED ORDER — MONTELUKAST SODIUM 10 MG PO TABS: 10.0000 mg | ORAL_TABLET | Freq: Every day | ORAL | Status: DC

## 2015-06-18 NOTE — Progress Notes (Signed)
Patient ID: Kristin Hahn, female   DOB: 09-13-1965, 49 y.o.   MRN: [id number]   Chief Complaint  Patient presents with  . Vaginal Pain    vaginal pain, mailly at night at rest, pt states this is a new pain. no d/c no odor no itchingno pain/bleeding with intercourse    HPI Kristin Hahn is a 49 y.o. female.  Here d/t labial pain that wakes her up at night.  States it is like a sharp stabbing pain.  Has not tried anything for it.  Denies any pain with intercourse or vaginal dryness.  Denies any urinary symptoms or change in vaginal discharge.  Does report menopausal symptoms of depression, hot flashes, irritability, mood swings, breast tenderness.  Discussed HRT and treatment for her depression.  Had hysterectomy years ago and then had oophrectomy in 1993.  Still has left ovary.  Currently sexually active with spouse.      HPI  Past Medical History  Diagnosis Date  . Hypertension   . Facet joint disease   . Asthma   . Renal disorder   . Gout   . Kidney stone     Past Surgical History  Procedure Laterality Date  . Abdominal hysterectomy    . Oophrectomy      right  . Tubal ligation    . Kidney stone removal    . Keloid removal    . Tonsillectomy    . Adenoidectomy      Family History  Problem Relation Age of Onset  . Diabetes Mother   . Hypertension Mother   . Diabetes Father   . Hypertension Father     Social History Social History  Substance Use Topics  . Smoking status: Never Smoker   . Smokeless tobacco: Not on file  . Alcohol Use: No    Allergies  Allergen Reactions  . Erythromycin Base Nausea And Vomiting    Current Outpatient Prescriptions  Medication Sig Dispense Refill  . albuterol (PROVENTIL HFA;VENTOLIN HFA) 108 (90 BASE) MCG/ACT inhaler Inhale 2 puffs into the lungs every 6 (six) hours as needed. Wheezing and shortness of breath    . amLODipine (NORVASC) 5 MG tablet Take 5 mg by mouth daily.    Marland Kitchen. ibuprofen (ADVIL,MOTRIN) 800 MG tablet Take 1  tablet (800 mg total) by mouth every 8 (eight) hours as needed for moderate pain. 30 tablet 0  . naproxen sodium (ANAPROX) 220 MG tablet Take 220 mg by mouth 2 (two) times daily with a meal.    . nebivolol (BYSTOLIC) 5 MG tablet Take 5 mg by mouth daily.    Marland Kitchen. olmesartan-hydrochlorothiazide (BENICAR HCT) 40-25 MG per tablet Take 1 tablet by mouth daily.    Marland Kitchen. oxyCODONE (OXYCONTIN) 40 MG 12 hr tablet Take 20 mg by mouth 2 (two) times daily as needed. Pain    . oxycodone (ROXICODONE) 30 MG immediate release tablet Take 15 mg by mouth. Q4-6H PRN    . buPROPion (WELLBUTRIN XL) 150 MG 24 hr tablet TAKE 1 TABLET BY MOUTH EVERY MORNING FOR 3 DAYS MAY INCREASE TO 2 TABS IF NEEDED (Patient not taking: Reported on 06/18/2015) 60 tablet 2  . cloNIDine (CATAPRES) 0.1 MG tablet Take 0.1 mg by mouth daily.    Marland Kitchen. estradiol (ESTRACE) 1 MG tablet Take 1 tablet (1 mg total) by mouth daily. 30 tablet 12  . fluticasone (FLONASE) 50 MCG/ACT nasal spray Place 2 sprays into both nostrils daily. 16 g 2  . hydrocortisone (ANUSOL-HC) 25 MG suppository  Place 1 suppository (25 mg total) rectally 2 (two) times daily. 12 suppository 0  . montelukast (SINGULAIR) 10 MG tablet Take 1 tablet (10 mg total) by mouth at bedtime. 30 tablet 12  . predniSONE (DELTASONE) 50 MG tablet Take 1 pill daily for 5 days. (Patient not taking: Reported on 06/18/2015) 5 tablet 0  . spironolactone (ALDACTONE) 50 MG tablet Take 1 tablet (50 mg total) by mouth daily. (Patient not taking: Reported on 06/18/2015) 30 tablet 6  . [DISCONTINUED] norethindrone (MICRONOR,CAMILA,ERRIN) 0.35 MG tablet Take 1 tablet (0.35 mg total) by mouth daily. (Patient not taking: Reported on 07/30/2014) 1 Package 11   No current facility-administered medications for this visit.    Review of Systems Review of Systems Constitutional: negative for fatigue and weight loss Respiratory: negative for cough and wheezing Cardiovascular: negative for chest pain, fatigue and  palpitations Gastrointestinal: negative for abdominal pain and change in bowel habits Genitourinary:negative Integument/breast: negative for nipple discharge Musculoskeletal:negative for myalgias Neurological: negative for gait problems and tremors Behavioral/Psych: negative for abusive relationship, depression Endocrine: negative for temperature intolerance     Blood pressure 142/83, pulse 74, weight 268 lb (121.564 kg).  Physical Exam Physical Exam General:   alert  Skin:   no rash or abnormalities  Lungs:   wheezing noted on right side  Heart:   regular rate and rhythm, S1, S2 normal, no murmur, click, rub or gallop  Breasts:   deferred  Abdomen:  normal findings: no organomegaly, soft, non-tender and no hernia difficult to assess d/t body habitus  Pelvis:  External genitalia: normal general appearance Urinary system: urethral meatus normal and bladder without fullness, nontender Vaginal: normal without tenderness, induration or masses, slight vaginal atrophy present Cervix: surgically absent Adnexa: unable to perform d/t body habitus Uterus: surgically absent    50% of 30 min visit spent on counseling and coordination of care.   Data Reviewed Previous medical hx, labs, meds  Assessment     Postmenopausal symptoms Asthma H/O hemorrhoids    Plan    Orders Placed This Encounter  Procedures  . SureSwab, Vaginosis/Vaginitis Plus   Meds ordered this encounter  Medications  . naproxen sodium (ANAPROX) 220 MG tablet    Sig: Take 220 mg by mouth 2 (two) times daily with a meal.  . estradiol (ESTRACE) 1 MG tablet    Sig: Take 1 tablet (1 mg total) by mouth daily.    Dispense:  30 tablet    Refill:  12  . montelukast (SINGULAIR) 10 MG tablet    Sig: Take 1 tablet (10 mg total) by mouth at bedtime.    Dispense:  30 tablet    Refill:  12  . fluticasone (FLONASE) 50 MCG/ACT nasal spray    Sig: Place 2 sprays into both nostrils daily.    Dispense:  16 g    Refill:  2   . hydrocortisone (ANUSOL-HC) 25 MG suppository    Sig: Place 1 suppository (25 mg total) rectally 2 (two) times daily.    Dispense:  12 suppository    Refill:  0   Possible management options include: Osphena, or other HRT treatment Follow up in 3 months.

## 2015-06-22 LAB — SURESWAB, VAGINOSIS/VAGINITIS PLUS
Atopobium vaginae: NOT DETECTED Log (cells/mL)
C. albicans, DNA: NOT DETECTED
C. glabrata, DNA: NOT DETECTED
C. parapsilosis, DNA: NOT DETECTED
C. trachomatis RNA, TMA: NOT DETECTED
C. tropicalis, DNA: NOT DETECTED
Gardnerella vaginalis: <4.7 Log (cells/mL)
LACTOBACILLUS SPECIES: NOT DETECTED Log (cells/mL)
MEGASPHAERA SPECIES: NOT DETECTED Log (cells/mL)
N. gonorrhoeae RNA, TMA: NOT DETECTED
T. vaginalis RNA, QL TMA: NOT DETECTED

## 2015-07-31 ENCOUNTER — Other Ambulatory Visit: Payer: Self-pay | Admitting: Certified Nurse Midwife

## 2015-07-31 MED ORDER — BUPROPION HCL ER (XL) 150 MG PO TB24: 150.0000 mg | ORAL_TABLET | Freq: Every day | ORAL | Status: DC

## 2015-08-04 ENCOUNTER — Telehealth: Payer: Self-pay | Admitting: Certified Nurse Midwife

## 2015-08-04 NOTE — Telephone Encounter (Signed)
Kristin Hahn Pt will like to know another options for vagina spasmus, pt continues with  pain, Pt also  is scare about estrogens side effects. Please let me know if pt needs to make and appt to address her situation.  [employer]

## 2015-08-04 NOTE — Telephone Encounter (Signed)
Pt called to request refill of  (buPROPion (WELLBUTRIN XL) 150 MG 24 hr tablet) [facility] return pt phone call at [phone] to notify pt that her rx is at the pharmacy but I was unable to speak to pt. I let a VM at work saying to call back to office. [employer]

## 2015-08-06 ENCOUNTER — Other Ambulatory Visit: Payer: Self-pay | Admitting: Certified Nurse Midwife

## 2015-08-06 NOTE — Telephone Encounter (Signed)
Please have patient make appointment to discuss her options.  Thank you.  R.Denney CNM

## 2015-10-05 ENCOUNTER — Telehealth: Payer: Self-pay | Admitting: Obstetrics

## 2015-10-05 ENCOUNTER — Telehealth: Payer: Self-pay | Admitting: *Deleted

## 2015-10-05 NOTE — Telephone Encounter (Signed)
Patient states she is menopausal and she needs something for anxiety. 3:33 Patient call given to Steward Drone to schedule - medication consult.

## 2015-10-06 ENCOUNTER — Ambulatory Visit (INDEPENDENT_AMBULATORY_CARE_PROVIDER_SITE_OTHER): Admitting: Certified Nurse Midwife

## 2015-10-06 ENCOUNTER — Encounter: Payer: Self-pay | Admitting: Certified Nurse Midwife

## 2015-10-06 VITALS — BP 142/85 | HR 99 | Wt 260.0 lb

## 2015-10-06 DIAGNOSIS — N7689 Other specified inflammation of vagina and vulva: Secondary | ICD-10-CM

## 2015-10-06 DIAGNOSIS — N951 Menopausal and female climacteric states: Secondary | ICD-10-CM | POA: Diagnosis not present

## 2015-10-06 DIAGNOSIS — L309 Dermatitis, unspecified: Secondary | ICD-10-CM

## 2015-10-06 MED ORDER — CLOBETASOL PROPIONATE 0.05 % EX CREA: 1.0000 "application " | TOPICAL_CREAM | Freq: Two times a day (BID) | CUTANEOUS | Status: DC

## 2015-10-06 MED ORDER — PAROXETINE MESYLATE 7.5 MG PO CAPS: 1.0000 | ORAL_CAPSULE | Freq: Every day | ORAL | Status: DC

## 2015-10-06 MED ORDER — BUPROPION HCL ER (XL) 300 MG PO TB24: 300.0000 mg | ORAL_TABLET | Freq: Every day | ORAL | Status: DC

## 2015-10-06 NOTE — Progress Notes (Signed)
Patient ID: Kristin Hahn, female   DOB: 1966/07/15, 50 y.o.   MRN: [id number]  Chief Complaint  Patient presents with  . Vaginal Pain    vaginal pain-external.  Had been given estrogen in the past but has not taken.    HPI FRANCELY Hahn is a 50 y.o. female.  Here for vuvular pain due that has increased since December.  Denies any vaginal dryness or pain with sexual intercourse.  States that the pain is a constant dull ache.  Does not shave her pubic hair.  Is positive for obesity.  Also has a hx of abdominal hysterectomy in 2003 with right oophrectomy around 2005.  Has left ovary.  Denies any vaginal symptoms.  Does have nightly hot flashes that wake her up.  Also states that the vulvar pain is waking her up at night and worse when she is laying down.  In December she started back on the Wellbutrin, has not noticed a significant decrease in her menopausal symptoms yet.  Did not try the oral estrogen that was given to her in December due to fears about the side effects. Denies any significant weight change.  Is employed.  Patient verbalized understanding of POC and options.      HPI  Past Medical History  Diagnosis Date  . Hypertension   . Facet joint disease   . Asthma   . Renal disorder   . Gout   . Kidney stone     Past Surgical History  Procedure Laterality Date  . Abdominal hysterectomy    . Oophrectomy      right  . Tubal ligation    . Kidney stone removal    . Keloid removal    . Tonsillectomy    . Adenoidectomy      Family History  Problem Relation Age of Onset  . Diabetes Mother   . Hypertension Mother   . Diabetes Father   . Hypertension Father     Social History Social History  Substance Use Topics  . Smoking status: Never Smoker   . Smokeless tobacco: Not on file  . Alcohol Use: No    Allergies  Allergen Reactions  . Erythromycin Base Nausea And Vomiting    Current Outpatient Prescriptions  Medication Sig Dispense Refill  . albuterol  (PROVENTIL HFA;VENTOLIN HFA) 108 (90 BASE) MCG/ACT inhaler Inhale 2 puffs into the lungs every 6 (six) hours as needed. Wheezing and shortness of breath    . amLODipine (NORVASC) 5 MG tablet Take 5 mg by mouth daily.    . fluticasone (FLONASE) 50 MCG/ACT nasal spray Place 2 sprays into both nostrils daily. 16 g 2  . olmesartan-hydrochlorothiazide (BENICAR HCT) 40-25 MG per tablet Take 1 tablet by mouth daily.    Marland Kitchen oxyCODONE (OXYCONTIN) 40 MG 12 hr tablet Take 20 mg by mouth 2 (two) times daily as needed. Pain    . oxycodone (ROXICODONE) 30 MG immediate release tablet Take 15 mg by mouth. Q4-6H PRN    . buPROPion (WELLBUTRIN XL) 300 MG 24 hr tablet Take 1 tablet (300 mg total) by mouth daily. 30 tablet 12  . clobetasol cream (TEMOVATE) 0.05 % Apply 1 application topically 2 (two) times daily. To labia for 2 weeks. 60 g 0  . cloNIDine (CATAPRES) 0.1 MG tablet Take 0.1 mg by mouth daily.    Marland Kitchen estradiol (ESTRACE) 1 MG tablet Take 1 tablet (1 mg total) by mouth daily. (Patient not taking: Reported on 10/06/2015) 30 tablet 12  .  hydrocortisone (ANUSOL-HC) 25 MG suppository Place 1 suppository (25 mg total) rectally 2 (two) times daily. 12 suppository 0  . ibuprofen (ADVIL,MOTRIN) 800 MG tablet Take 1 tablet (800 mg total) by mouth every 8 (eight) hours as needed for moderate pain. (Patient not taking: Reported on 10/06/2015) 30 tablet 0  . montelukast (SINGULAIR) 10 MG tablet Take 1 tablet (10 mg total) by mouth at bedtime. (Patient not taking: Reported on 10/06/2015) 30 tablet 12  . naproxen sodium (ANAPROX) 220 MG tablet Take 220 mg by mouth 2 (two) times daily with a meal. Reported on 10/06/2015    . PARoxetine Mesylate (BRISDELLE) 7.5 MG CAPS Take 1 tablet by mouth at bedtime. 30 capsule 12  . predniSONE (DELTASONE) 50 MG tablet Take 1 pill daily for 5 days. (Patient not taking: Reported on 06/18/2015) 5 tablet 0  . spironolactone (ALDACTONE) 50 MG tablet Take 1 tablet (50 mg total) by mouth daily.  (Patient not taking: Reported on 06/18/2015) 30 tablet 6  . [DISCONTINUED] norethindrone (MICRONOR,CAMILA,ERRIN) 0.35 MG tablet Take 1 tablet (0.35 mg total) by mouth daily. (Patient not taking: Reported on 07/30/2014) 1 Package 11   No current facility-administered medications for this visit.    Review of Systems Review of Systems Constitutional: negative for fatigue and weight loss Respiratory: negative for cough and wheezing Cardiovascular: negative for chest pain, fatigue and palpitations Gastrointestinal: negative for abdominal pain and change in bowel habits Genitourinary:+ vulvar pain Integument/breast: negative for nipple discharge Musculoskeletal:negative for myalgias Neurological: negative for gait problems and tremors Behavioral/Psych: negative for abusive relationship, depression Endocrine: negative for temperature intolerance     Blood pressure 142/85, pulse 99, weight 260 lb (117.935 kg).  Physical Exam Physical Exam General:   alert  Skin:   no rash or abnormalities  Lungs:   clear to auscultation bilaterally  Heart:   regular rate and rhythm, S1, S2 normal, no murmur, click, rub or gallop  Breasts:   deferred  Abdomen:  normal findings: no organomegaly, soft, non-tender and no hernia obese  Pelvis:  External genitalia: normal general appearance, + white scales noted on outside of labia, 1 area of excoriation on upper left labia, about pen top size, non draining, slightly tender to palpation.   Urinary system: urethral meatus normal and bladder without fullness, nontender Vaginal: normal without tenderness, induration or masses Cervix: normal appearance Adnexa: normal left ovary Uterus: surgically absent    50% of 15 min visit spent on counseling and coordination of care.   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   No orders of the defined types were placed in this encounter.   Meds ordered this encounter  Medications  . PARoxetine Mesylate (BRISDELLE) 7.5 MG CAPS    Sig: Take 1 tablet by mouth at bedtime.    Dispense:  30 capsule    Refill:  12  . buPROPion (WELLBUTRIN XL) 300 MG 24 hr tablet    Sig: Take 1 tablet (300 mg total) by mouth daily.    Dispense:  30 tablet    Refill:  12  . clobetasol cream (TEMOVATE) 0.05 %    Sig: Apply 1 application topically 2 (two) times daily. To labia for 2 weeks.    Dispense:  60 g    Refill:  0    Possible  management options include: Dr. Ashley Royalty consult for vuvular pain, or esring if topical estrogen is effective Follow up in 3 weeks.

## 2015-10-08 NOTE — Telephone Encounter (Signed)
CLOSE ENCOUNTER °

## 2015-10-27 ENCOUNTER — Ambulatory Visit: Admitting: Certified Nurse Midwife

## 2015-11-03 ENCOUNTER — Other Ambulatory Visit: Payer: Self-pay | Admitting: Certified Nurse Midwife

## 2015-11-03 DIAGNOSIS — N952 Postmenopausal atrophic vaginitis: Secondary | ICD-10-CM

## 2015-11-03 MED ORDER — ESTRADIOL 0.1 MG/GM VA CREA: 1.0000 | TOPICAL_CREAM | Freq: Every day | VAGINAL | Status: DC

## 2015-11-03 MED ORDER — K-Y LIQUIBEADS VA INST: 1.0000 | VAGINAL_INSERT | VAGINAL | Status: DC | PRN

## 2015-11-03 NOTE — Progress Notes (Signed)
Discussed problem with patient.  Last sexual intercourse was Sunday night.  Had spotting on Monday when she was wiping when she was using the bathroom.  Reports increased sexual activity lately.  Describes symptoms consistent with vaginal atrophy of vaginal dryness and itching.   Discussed POC with patient, estrace cream sent to the pharmacy along with K-Y jelly.  Patient verbalized understanding.  Encouraged f/u if symptoms are not improving after a week or two of estrace cream and [place].

## 2015-12-16 ENCOUNTER — Ambulatory Visit: Admitting: Certified Nurse Midwife

## 2016-01-15 ENCOUNTER — Encounter (HOSPITAL_COMMUNITY)

## 2016-01-21 ENCOUNTER — Telehealth: Payer: Self-pay | Admitting: *Deleted

## 2016-01-21 ENCOUNTER — Other Ambulatory Visit: Payer: Self-pay | Admitting: Certified Nurse Midwife

## 2016-01-21 DIAGNOSIS — F419 Anxiety disorder, unspecified: Secondary | ICD-10-CM

## 2016-01-21 DIAGNOSIS — F329 Major depressive disorder, single episode, unspecified: Secondary | ICD-10-CM

## 2016-01-21 DIAGNOSIS — F32A Depression, unspecified: Secondary | ICD-10-CM

## 2016-01-21 MED ORDER — CITALOPRAM HYDROBROMIDE 20 MG PO TABS: 20.0000 mg | ORAL_TABLET | Freq: Every day | ORAL | Status: DC

## 2016-01-21 NOTE — Telephone Encounter (Signed)
Patient notified- she said Lesle ReekBarb could call her anytime with the referral

## 2016-01-21 NOTE — Telephone Encounter (Signed)
Patient states she is going through a separation and she is presently using Wellbutrin. She states the medication is not working as well and would like to change or add to it. She is very emotional right now. Per Boykin Reaperachelle- call patient - let her know to stop using the Wellbutrin and start Celexa. She has also put in a referral for counciling. 1:30 Attempt to call patient- no VM

## 2016-04-05 ENCOUNTER — Ambulatory Visit (HOSPITAL_COMMUNITY)
Admission: EM | Admit: 2016-04-05 | Discharge: 2016-04-05 | Disposition: A | Discharge status: Home / Self Care | Attending: Family Medicine | Admitting: Family Medicine

## 2016-04-05 ENCOUNTER — Encounter (HOSPITAL_COMMUNITY): Payer: Self-pay | Admitting: Family Medicine

## 2016-04-05 ENCOUNTER — Other Ambulatory Visit: Payer: Self-pay | Admitting: Family Medicine

## 2016-04-05 DIAGNOSIS — M5442 Lumbago with sciatica, left side: Secondary | ICD-10-CM | POA: Diagnosis not present

## 2016-04-05 MED ORDER — MELOXICAM 15 MG PO TABS: 15.0000 mg | ORAL_TABLET | Freq: Every day | ORAL | 0 refills | Status: DC

## 2016-04-05 NOTE — ED Provider Notes (Signed)
CSN: [id number]     Arrival date & time 04/05/16  1603 History   First MD Initiated Contact with Patient 04/05/16 1619     Chief Complaint  Patient presents with  . Hip Pain   (Consider location/radiation/quality/duration/timing/severity/associated sxs/prior Treatment) HPI Patient reports a 3 days history of worsening left sided low back pain with radiation down the left leg.  She reports a long standing history of facet disease and sciatica.  She denies numbness, tingling, weakness, or falls.  No urinary retention or fecal incontinence.  No saddle anesthesia.  No history of injury.  She [place] pain management in [place] for her chronic pain.  She reports she has never seen an orthopedist.  She reports that she has had injections into her back in the past with moderate relief of symptoms but is not able to get into her pain management clinic until October.  She reports that she has been self weaning from opioid medication and has not filled her meds in a few months.  She reports no relief with Tresa GarterSoma that she had at home.  She is taking Motrin 200mg  4-5 times daily with minimal relief.  Pain is worse with standing and relieved by sitting.  Additionally, no h/o heart disease/ MI.   Past Medical History:  Diagnosis Date  . Asthma   . Facet joint disease   . Gout   . Hypertension   . Kidney stone   . Renal disorder    Past Surgical History:  Procedure Laterality Date  . ABDOMINAL HYSTERECTOMY    . ADENOIDECTOMY    . keloid removal    . kidney stone removal    . oophrectomy     right  . TONSILLECTOMY    . TUBAL LIGATION     Family History  Problem Relation Age of Onset  . Diabetes Mother   . Hypertension Mother   . Diabetes Father   . Hypertension Father    Social History  Substance Use Topics  . Smoking status: Never Smoker  . Smokeless tobacco: Not on file  . Alcohol use No   OB History    Gravida Para Term Preterm AB Living   2 2 2     2    SAB TAB Ectopic Multiple  Live Births           2     Review of Systems  Constitutional: Negative for activity change.  Musculoskeletal: Positive for arthralgias, back pain and gait problem.  Neurological: Negative for weakness and numbness.       No falls    Allergies  Erythromycin base  Home Medications   Prior to Admission medications   Medication Sig Start Date End Date Taking? Authorizing Provider  albuterol (PROVENTIL HFA;VENTOLIN HFA) 108 (90 BASE) MCG/ACT inhaler Inhale 2 puffs into the lungs every 6 (six) hours as needed. Wheezing and shortness of breath    Historical Provider, MD  amLODipine (NORVASC) 5 MG tablet Take 5 mg by mouth daily.    Historical Provider, MD  citalopram (CELEXA) 20 MG tablet Take 1 tablet (20 mg total) by mouth daily. 01/21/16   Rachelle A Denney, CNM  clobetasol cream (TEMOVATE) 0.05 % Apply 1 application topically 2 (two) times daily. To labia for 2 weeks. 10/06/15   Rachelle A Denney, CNM  cloNIDine (CATAPRES) 0.1 MG tablet Take 0.1 mg by mouth daily. 04/04/12 07/30/14  Earley FavorGail Schulz, NP  estradiol (ESTRACE) 0.1 MG/GM vaginal cream Place 1 Applicatorful vaginally at bedtime. 11/03/15  Rachelle A Denney, CNM  estradiol (ESTRACE) 1 MG tablet Take 1 tablet (1 mg total) by mouth daily. Patient not taking: Reported on 10/06/2015 06/18/15   Rachelle A Denney, CNM  fluticasone (FLONASE) 50 MCG/ACT nasal spray Place 2 sprays into both nostrils daily. 06/18/15   Rachelle A Denney, CNM  hydrocortisone (ANUSOL-HC) 25 MG suppository Place 1 suppository (25 mg total) rectally 2 (two) times daily. 06/18/15   Rachelle A Denney, CNM  meloxicam (MOBIC) 15 MG tablet Take 1 tablet (15 mg total) by mouth daily. 04/05/16   Ashly Hulen Skains, DO  montelukast (SINGULAIR) 10 MG tablet Take 1 tablet (10 mg total) by mouth at bedtime. Patient not taking: Reported on 10/06/2015 06/18/15   Rachelle A Denney, CNM  naproxen sodium (ANAPROX) 220 MG tablet Take 220 mg by mouth 2 (two) times daily with a meal.  Reported on 10/06/2015    Historical Provider, MD  olmesartan-hydrochlorothiazide (BENICAR HCT) 40-25 MG per tablet Take 1 tablet by mouth daily.    Historical Provider, MD  oxyCODONE (OXYCONTIN) 40 MG 12 hr tablet Take 20 mg by mouth 2 (two) times daily as needed. Pain    Historical Provider, MD  oxycodone (ROXICODONE) 30 MG immediate release tablet Take 15 mg by mouth. Q4-6H PRN    Historical Provider, MD  PARoxetine Mesylate (BRISDELLE) 7.5 MG CAPS Take 1 tablet by mouth at bedtime. 10/06/15   Rachelle A Denney, CNM  predniSONE (DELTASONE) 50 MG tablet Take 1 pill daily for 5 days. Patient not taking: Reported on 06/18/2015 02/07/15   Charm Rings, MD  spironolactone (ALDACTONE) 50 MG tablet Take 1 tablet (50 mg total) by mouth daily. Patient not taking: Reported on 06/18/2015 06/03/14   Chrystie Nose, MD  Vaginal Moisturizer (K-Y Lavonna Rua) INST Place 1 each vaginally as needed. 11/03/15   Roe Coombs, CNM   Meds Ordered and Administered this Visit  Medications - No data to display  BP (!) 166/102 (BP Location: Right Arm)   Pulse 83   Resp 18   SpO2 100%  No data found.   Physical Exam  Constitutional: She is oriented to person, place, and time. She appears well-developed. No distress.  obese  HENT:  Head: Normocephalic and atraumatic.  Eyes: Conjunctivae and EOM are normal.  Neck: Normal range of motion.  Cardiovascular: Normal rate.   Pulmonary/Chest: Effort normal.  Musculoskeletal:       Lumbar back: She exhibits decreased range of motion and pain. She exhibits no tenderness, no bony tenderness, no deformity and no spasm.  Decreased ROM in flexion and extension secondary to pain.  Negative straight leg raise test bilaterally.  Negative FADIR/FABER on right.  +pain with FADIR on left, negative FABER.  Ambulates independently, gait antalgic.  Neurological: She is alert and oriented to person, place, and time. She has normal strength. Coordination normal.  Light touch  sensation in tact  Skin: She is not diaphoretic.    Urgent Care Course   Clinical Course    Procedures (including critical care time)  Labs Review Labs Reviewed - No data to display  Imaging Review No results found.   MDM   1. Left-sided low back pain with left-sided sciatica    BURKLEY DECH is a 50 y.o. 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  Physical exam consistent with sciatic pain.  No red flags on exam or in history.  No focal neurologic findings.  Patient sees pain management in [place] (  Dr Burtis Junes).  [place] narcotic database reviewed; Last Oxycodone 10mg  fill 03/16/2016 for #120 tablets.  Patient discharged with Mobic 15mg  qd for 2 weeks prn.  Instructed to avoid use of other NSAIDs while on Mobic.  She was given information for local orthopedic office.  She was instructed to follow up with PCP for further evaluation and referral to ortho if needed.  Will defer further management of chronic back pain to them.  Return precautions reviewed.  Patient discharged in stable condition.  Meds ordered this encounter  Medications  . meloxicam (MOBIC) 15 MG tablet    Sig: Take 1 tablet (15 mg total) by mouth daily.    Dispense:  14 tablet    Refill:  0   This patient was discussed with Dr Artis Flock, who agrees with my assessment and plan.   Ashly M. Nadine Counts, DO PGY-3, [facility]        Raliegh Ip, DO 04/05/16 [phone]

## 2016-04-05 NOTE — Discharge Instructions (Signed)
I recommend that you discuss orthopedic referral with your primary care doctor.  I will defer steroid injection to them.  I have prescribed Mobic for you to take daily.  DO NOT take Motrin/Advil/Ibuprofen/Aleve/Naproxen while on this medication.

## 2016-04-05 NOTE — ED Triage Notes (Signed)
Pt   Reports    l  Hip  And    Pain  Radiating  Down l  Leg    Pt   Reports   The pain  Radiates  Down l  Leg         She  Reports  A  History  Of  Gout  bursistis      denys  Any  specefic  Injury

## 2016-04-27 ENCOUNTER — Other Ambulatory Visit: Payer: Self-pay | Admitting: Certified Nurse Midwife

## 2016-04-27 DIAGNOSIS — L309 Dermatitis, unspecified: Secondary | ICD-10-CM

## 2016-04-27 DIAGNOSIS — F32A Depression, unspecified: Secondary | ICD-10-CM

## 2016-04-27 DIAGNOSIS — F329 Major depressive disorder, single episode, unspecified: Secondary | ICD-10-CM

## 2016-04-27 DIAGNOSIS — F419 Anxiety disorder, unspecified: Secondary | ICD-10-CM

## 2016-04-27 NOTE — Telephone Encounter (Signed)
Please advised and/or send refills

## 2016-05-24 ENCOUNTER — Other Ambulatory Visit: Payer: Self-pay | Admitting: Certified Nurse Midwife

## 2016-05-24 DIAGNOSIS — F32A Depression, unspecified: Secondary | ICD-10-CM

## 2016-05-24 DIAGNOSIS — F419 Anxiety disorder, unspecified: Secondary | ICD-10-CM

## 2016-05-24 DIAGNOSIS — F329 Major depressive disorder, single episode, unspecified: Secondary | ICD-10-CM

## 2016-05-25 NOTE — Telephone Encounter (Signed)
Please review

## 2016-05-31 ENCOUNTER — Other Ambulatory Visit: Payer: Self-pay | Admitting: Certified Nurse Midwife

## 2016-06-23 ENCOUNTER — Ambulatory Visit (INDEPENDENT_AMBULATORY_CARE_PROVIDER_SITE_OTHER): Admitting: Certified Nurse Midwife

## 2016-06-23 ENCOUNTER — Encounter: Payer: Self-pay | Admitting: Certified Nurse Midwife

## 2016-06-23 VITALS — BP 123/80 | HR 86 | Temp 98.6°F | Wt 238.0 lb

## 2016-06-23 DIAGNOSIS — Z1151 Encounter for screening for human papillomavirus (HPV): Secondary | ICD-10-CM

## 2016-06-23 DIAGNOSIS — N951 Menopausal and female climacteric states: Secondary | ICD-10-CM | POA: Diagnosis not present

## 2016-06-23 DIAGNOSIS — L409 Psoriasis, unspecified: Secondary | ICD-10-CM | POA: Diagnosis not present

## 2016-06-23 DIAGNOSIS — Z01419 Encounter for gynecological examination (general) (routine) without abnormal findings: Secondary | ICD-10-CM

## 2016-06-23 DIAGNOSIS — Z113 Encounter for screening for infections with a predominantly sexual mode of transmission: Secondary | ICD-10-CM | POA: Diagnosis not present

## 2016-06-23 DIAGNOSIS — Z124 Encounter for screening for malignant neoplasm of cervix: Secondary | ICD-10-CM

## 2016-06-23 DIAGNOSIS — Z0000 Encounter for general adult medical examination without abnormal findings: Secondary | ICD-10-CM

## 2016-06-23 MED ORDER — ALPRAZOLAM 1 MG PO TABS: 1.0000 mg | ORAL_TABLET | Freq: Every evening | ORAL | 2 refills | Status: DC | PRN

## 2016-06-23 MED ORDER — TRIAMCINOLONE ACETONIDE 0.5 % EX OINT: 1.0000 "application " | TOPICAL_OINTMENT | Freq: Four times a day (QID) | CUTANEOUS | 2 refills | Status: DC

## 2016-06-23 NOTE — Progress Notes (Signed)
Subjective:        Kristin Hahn is a 50 y.o. female here for a routine exam.  Current complaints: menopausal symptoms: is taking wellbutrin.  Had too many side effects with Celexa.    Mom has BCA.  Goes Dec. 2nd for next mammogram: [facility].  Tried Estrace cream, but did not keep up with it. Reports increased anxiety: has tried buspirone.  Has tried black cohash for the hot flashes.  Has tried gabapentin.  Goes to pain management.    Reports dry areas of skin generalized.  Has had an area on her buttocks that has been irritated for over a year; has tried antibiotic ointment and antifungal creams.    Was seen 10/05/16 for Vulvular pain, it is improved.  Does not shave her pubic hair.  Is positive for obesity.  Also has a hx of abdominal hysterectomy in 2003 with right oophrectomy around 2005.  Has left ovary.  Denies any vaginal symptoms.  Does have nightly hot flashes that wake her up.  Also states that the vulvar pain is waking her up at night and worse when she is laying down.  In December she started back on the Wellbutrin, has not noticed a significant decrease in her menopausal symptoms yet.  Did not try the oral estrogen that was given to last year due to fears about the side effects. Denies any significant weight change.  Is employed.  Patient verbalized understanding of POC and options.       Personal health questionnaire:  Is patient Ashkenazi Jewish, have a family history of breast and/or ovarian cancer: yes Is there a family history of uterine cancer diagnosed at age < 5550, gastrointestinal cancer, urinary tract cancer, family member who is a Personnel officerLynch syndrome-associated carrier: no Is the patient overweight and hypertensive, family history of diabetes, personal history of gestational diabetes, preeclampsia or PCOS: yes Is patient over 6855, have PCOS,  family history of premature CHD under age 50, diabetes, smoke, have hypertension or peripheral artery disease:  yes At any time, has a  partner hit, kicked or otherwise hurt or frightened you?: no Over the past 2 weeks, have you felt down, depressed or hopeless?: no Over the past 2 weeks, have you felt little interest or pleasure in doing things?:no   Gynecologic History No LMP recorded. Patient has had a hysterectomy. Contraception: status post hysterectomy Last Pap: unknown. Results were: normal Last mammogram: 12/16. Results were: normal  Obstetric History OB History  Gravida Para Term Preterm AB Living  2 2 2     2   SAB TAB Ectopic Multiple Live Births          2    # Outcome Date GA Lbr Len/2nd Weight Sex Delivery Anes PTL Lv  2 Term 10/23/87 [id number]  6 lb 11 oz (3.033 kg) F Vag-Spont None  LIV  1 Term 08/15/83 [id number]  7 lb 3 oz (3.26 kg) F Vag-Spont EPI  LIV      Past Medical History:  Diagnosis Date  . Asthma   . Facet joint disease   . Gout   . Hypertension   . Kidney stone   . Renal disorder     Past Surgical History:  Procedure Laterality Date  . ABDOMINAL HYSTERECTOMY    . ADENOIDECTOMY    . keloid removal    . kidney stone removal    . oophrectomy     right  . TONSILLECTOMY    . TUBAL LIGATION  Current Outpatient Prescriptions:  .  amLODipine (NORVASC) 5 MG tablet, Take 5 mg by mouth daily., Disp: , Rfl:  .  olmesartan-hydrochlorothiazide (BENICAR HCT) 40-25 MG per tablet, Take 1 tablet by mouth daily., Disp: , Rfl:  .  oxycodone (ROXICODONE) 30 MG immediate release tablet, Take 15 mg by mouth. Q4-6H PRN, Disp: , Rfl:  .  PARoxetine Mesylate (BRISDELLE) 7.5 MG CAPS, Take 1 tablet by mouth at bedtime., Disp: 30 capsule, Rfl: 12 .  albuterol (PROVENTIL HFA;VENTOLIN HFA) 108 (90 BASE) MCG/ACT inhaler, Inhale 2 puffs into the lungs every 6 (six) hours as needed. Wheezing and shortness of breath, Disp: , Rfl:  .  ALPRAZolam (XANAX) 1 MG tablet, Take 1 tablet (1 mg total) by mouth at bedtime as needed for anxiety., Disp: 30 tablet, Rfl: 2 .  citalopram (CELEXA) 20 MG tablet, TAKE 1  TABLET(20 MG) BY MOUTH DAILY (Patient not taking: Reported on 06/23/2016), Disp: 30 tablet, Rfl: 0 .  clobetasol cream (TEMOVATE) 0.05 %, APPLY EXTERNALLY TO THE AFFECTED AREA TWICE DAILY TO LABIA FOR TWO WEEKS (Patient not taking: Reported on 06/23/2016), Disp: 60 g, Rfl: 0 .  cloNIDine (CATAPRES) 0.1 MG tablet, Take 0.1 mg by mouth daily., Disp: , Rfl:  .  estradiol (ESTRACE) 0.1 MG/GM vaginal cream, Place 1 Applicatorful vaginally at bedtime. (Patient not taking: Reported on 06/23/2016), Disp: 42.5 g, Rfl: 12 .  estradiol (ESTRACE) 1 MG tablet, Take 1 tablet (1 mg total) by mouth daily. (Patient not taking: Reported on 06/23/2016), Disp: 30 tablet, Rfl: 12 .  fluticasone (FLONASE) 50 MCG/ACT nasal spray, Place 2 sprays into both nostrils daily. (Patient not taking: Reported on 06/23/2016), Disp: 16 g, Rfl: 2 .  hydrocortisone (ANUSOL-HC) 25 MG suppository, Place 1 suppository (25 mg total) rectally 2 (two) times daily. (Patient not taking: Reported on 06/23/2016), Disp: 12 suppository, Rfl: 0 .  meloxicam (MOBIC) 15 MG tablet, Take 1 tablet (15 mg total) by mouth daily. (Patient not taking: Reported on 06/23/2016), Disp: 14 tablet, Rfl: 0 .  montelukast (SINGULAIR) 10 MG tablet, Take 1 tablet (10 mg total) by mouth at bedtime. (Patient not taking: Reported on 06/23/2016), Disp: 30 tablet, Rfl: 12 .  naproxen sodium (ANAPROX) 220 MG tablet, Take 220 mg by mouth 2 (two) times daily with a meal. Reported on 10/06/2015, Disp: , Rfl:  .  oxyCODONE (OXYCONTIN) 40 MG 12 hr tablet, Take 20 mg by mouth 2 (two) times daily as needed. Pain, Disp: , Rfl:  .  predniSONE (DELTASONE) 50 MG tablet, Take 1 pill daily for 5 days. (Patient not taking: Reported on 06/23/2016), Disp: 5 tablet, Rfl: 0 .  spironolactone (ALDACTONE) 50 MG tablet, Take 1 tablet (50 mg total) by mouth daily. (Patient not taking: Reported on 06/23/2016), Disp: 30 tablet, Rfl: 6 .  triamcinolone ointment (KENALOG) 0.5 %, Apply 1 application  topically 4 (four) times daily., Disp: 90 g, Rfl: 2 .  Vaginal Moisturizer (K-Y LIQUIBEADS) INST, Place 1 each vaginally as needed. (Patient not taking: Reported on 06/23/2016), Disp: 12 each, Rfl: PRN Allergies  Allergen Reactions  . Erythromycin Base Nausea And Vomiting    Social History  Substance Use Topics  . Smoking status: Never Smoker  . Smokeless tobacco: Not on file  . Alcohol use No    Family History  Problem Relation Age of Onset  . Diabetes Mother   . Hypertension Mother   . Diabetes Father   . Hypertension Father       Review of Systems  Constitutional: negative for fatigue and weight loss Respiratory: negative for cough and wheezing Cardiovascular: negative for chest pain, fatigue and palpitations Gastrointestinal: negative for abdominal pain and change in bowel habits Musculoskeletal:negative for myalgias Neurological: negative for gait problems and tremors Behavioral/Psych: negative for abusive relationship, depression Endocrine: negative for temperature intolerance    Genitourinary:negative for abnormal menstrual periods, genital lesions, hot flashes, sexual problems and vaginal discharge Integument/breast: negative for breast lump, breast tenderness, nipple discharge and skin lesion(s)    Objective:       BP 123/80   Pulse 86   Temp 98.6 F (37 C)   Wt 238 lb (108 kg)   BMI 43.53 kg/m  General:   alert  Skin:   no rash or abnormalities  Lungs:   clear to auscultation bilaterally  Heart:   regular rate and rhythm, S1, S2 normal, no murmur, click, rub or gallop  Breasts:   normal without suspicious masses, skin or nipple changes or axillary nodes  Abdomen:  normal findings: no organomegaly, soft, non-tender and no hernia obese  Pelvis:  External genitalia: normal general appearance Urinary system: urethral meatus normal and bladder without fullness, nontender Vaginal: normal without tenderness, induration or masses Cervix: normal  appearance Adnexa: normal bimanual exam Uterus: anteverted and non-tender, normal size   + large amount of scaling on left buttock into vaginal area and down both upper legs  Lab Review Urine pregnancy test Labs reviewed yes Radiologic studies reviewed no  50% of 30 min visit spent on counseling and coordination of care.    Assessment:    Healthy female exam.   obesity  post menopausal status: hot flashes   Psoriasis   STD screening exam  Plan:    Education reviewed: calcium supplements, depression evaluation, low fat, low cholesterol diet, safe sex/STD prevention, self breast exams, skin cancer screening and weight bearing exercise. Contraception: post menopausal status. Follow up in: 2 years.   Meds ordered this encounter  Medications  . ALPRAZolam (XANAX) 1 MG tablet    Sig: Take 1 tablet (1 mg total) by mouth at bedtime as needed for anxiety.    Dispense:  30 tablet    Refill:  2  . triamcinolone ointment (KENALOG) 0.5 %    Sig: Apply 1 application topically 4 (four) times daily.    Dispense:  90 g    Refill:  2   Orders Placed This Encounter  Procedures  . NuSwab Vaginitis Plus (VG+)  . RPR  . Hepatitis C antibody  . Hepatitis B surface antigen  . HIV antibody  . Ambulatory referral to Dermatology    Referral Priority:   Routine    Referral Type:   Consultation    Referral Reason:   Specialty Services Required    Requested Specialty:   Dermatology    Number of Visits Requested:   1  . Ambulatory referral to Internal Medicine    Referral Priority:   Routine    Referral Type:   Consultation    Referral Reason:   Specialty Services Required    Requested Specialty:   Internal Medicine    Number of Visits Requested:   1   Need to obtain previous records for mammograms Follow up as needed.

## 2016-06-24 LAB — HIV ANTIBODY (ROUTINE TESTING W REFLEX): HIV Screen 4th Generation wRfx: NONREACTIVE

## 2016-06-24 LAB — HEPATITIS C ANTIBODY: Hep C Virus Ab: <0.1 s/co ratio (ref 0.0–0.9)

## 2016-06-24 LAB — RPR: RPR Ser Ql: NONREACTIVE

## 2016-06-24 LAB — HEPATITIS B SURFACE ANTIGEN: Hepatitis B Surface Ag: NEGATIVE

## 2016-06-26 LAB — NUSWAB VAGINITIS PLUS (VG+)
Candida albicans, NAA: NEGATIVE
Candida glabrata, NAA: NEGATIVE
Chlamydia trachomatis, NAA: NEGATIVE
Neisseria gonorrhoeae, NAA: NEGATIVE
Trich vag by NAA: NEGATIVE

## 2016-06-27 ENCOUNTER — Encounter: Payer: Self-pay | Admitting: Certified Nurse Midwife

## 2016-06-28 LAB — CYTOLOGY - PAP
Diagnosis: NEGATIVE
HPV: NOT DETECTED

## 2016-07-08 ENCOUNTER — Telehealth: Payer: Self-pay | Admitting: *Deleted

## 2016-07-08 NOTE — Telephone Encounter (Signed)
Patient is calling to request a medication for yeast- she was treated with antibiotic by her dentist. She also wants to verify that her test results were ok.

## 2016-07-11 ENCOUNTER — Other Ambulatory Visit: Payer: Self-pay | Admitting: Certified Nurse Midwife

## 2016-07-11 DIAGNOSIS — B3731 Acute candidiasis of vulva and vagina: Secondary | ICD-10-CM

## 2016-07-11 DIAGNOSIS — B373 Candidiasis of vulva and vagina: Secondary | ICD-10-CM

## 2016-07-11 MED ORDER — FLUCONAZOLE 200 MG PO TABS: 200.0000 mg | ORAL_TABLET | Freq: Once | ORAL | 0 refills | Status: AC

## 2016-07-11 NOTE — Telephone Encounter (Signed)
Yes her labs that I did were normal.  Please let her know that I have sent in Diflucan for her to take.  Thank you.  R.Denney CNM

## 2016-07-20 ENCOUNTER — Telehealth: Payer: Self-pay

## 2016-07-20 NOTE — Telephone Encounter (Signed)
Pt's daughter, Kristin Hahn, referring her mother, Kristin Hahn. Wanting to know if Dr. Drue NovelPaz would be able to accept her. Pt has [employer]. Informed I would send message to Dr. Drue NovelPaz to see. Rickeda verbalized understanding.

## 2016-07-20 NOTE — Telephone Encounter (Signed)
Spoke w/ Pt's daughter Marge DuncansRickeda. Informed that Dr. Drue NovelPaz is not seeing new patients but that Dr. Patsy Lageropland and Dr. Carmelia RollerWendling at our office are. Rickeda will inform Pt.

## 2016-07-20 NOTE — Telephone Encounter (Signed)
Unfortunately I'm not accepting new patients however there are other providers in this office that do, THX!

## 2016-07-22 ENCOUNTER — Ambulatory Visit (INDEPENDENT_AMBULATORY_CARE_PROVIDER_SITE_OTHER): Admitting: Allergy

## 2016-07-22 ENCOUNTER — Encounter: Payer: Self-pay | Admitting: Allergy

## 2016-07-22 ENCOUNTER — Ambulatory Visit: Admitting: Allergy

## 2016-07-22 VITALS — BP 146/88 | HR 72 | Temp 98.2°F | Resp 14 | Ht 62.5 in | Wt 235.2 lb

## 2016-07-22 DIAGNOSIS — J4521 Mild intermittent asthma with (acute) exacerbation: Secondary | ICD-10-CM

## 2016-07-22 DIAGNOSIS — J988 Other specified respiratory disorders: Secondary | ICD-10-CM | POA: Diagnosis not present

## 2016-07-22 MED ORDER — FLUTICASONE PROPIONATE HFA 110 MCG/ACT IN AERO: 2.0000 | INHALATION_SPRAY | Freq: Two times a day (BID) | RESPIRATORY_TRACT | 5 refills | Status: DC

## 2016-07-22 MED ORDER — ALBUTEROL SULFATE (2.5 MG/3ML) 0.083% IN NEBU: 2.5000 mg | INHALATION_SOLUTION | RESPIRATORY_TRACT | 5 refills | Status: DC | PRN

## 2016-07-22 MED ORDER — FLUCONAZOLE 150 MG PO TABS: ORAL_TABLET | ORAL | 0 refills | Status: DC

## 2016-07-22 MED ORDER — METHYLPREDNISOLONE ACETATE 80 MG/ML IJ SUSP: 80.0000 mg | Freq: Once | INTRAMUSCULAR | Status: DC

## 2016-07-22 MED ORDER — ALBUTEROL SULFATE HFA 108 (90 BASE) MCG/ACT IN AERS: 2.0000 | INHALATION_SPRAY | RESPIRATORY_TRACT | 5 refills | Status: AC | PRN

## 2016-07-22 MED ORDER — FLUTICASONE PROPIONATE 50 MCG/ACT NA SUSP: 2.0000 | Freq: Every day | NASAL | 5 refills | Status: DC

## 2016-07-22 MED ORDER — AMOXICILLIN-POT CLAVULANATE 875-125 MG PO TABS: 1.0000 | ORAL_TABLET | Freq: Two times a day (BID) | ORAL | 0 refills | Status: DC

## 2016-07-22 MED ORDER — MOMETASONE FUROATE 0.1 % EX CREA: 1.0000 "application " | TOPICAL_CREAM | Freq: Every day | CUTANEOUS | 4 refills | Status: DC

## 2016-07-22 NOTE — Progress Notes (Signed)
Follow-up Note  RE: Kristin Hahn MRN: [id number] DOB: Jan 29, 1966 Date of Office Visit: 07/22/2016   History of present illness: Kristin Hahn is a 50 y.o. female presenting today for sick visit.Marland Kitchen. She was last seen in our office in 12/31/2013 by Dr. Lucie LeatherKozlow for asthma, allergic rhinitis and atopic dermatitis; she also had a respiratory tract infection at the time. At that visit she was treated with Depo-Medrol injection and Augmentin, advised to use Arnuity inhaler as well as Nasonex, cetirizine and montelukast.  She reports she no longer takes any of these medications and has been off them for over a year.  She returns today as over the past week she has been having wheezing, pain with inhalation mostly on the right side of her chest as well as a cough. She is also having frontal headache and she feels very congested in her nose and her ears feel muffled. She reports her nasal congestion is so bad currently that she is not even able to blow anything out. Symptoms have been worsening over the past week. She did find her albuterol inhaler which she has used 3-4 times over the past week.  She also has history of eczema that reports gets worse in the winter mostly on her hands and arms. She would like a refill of the steroid cream that she has used in the past.   Review of systems: Review of Systems  Constitutional: Positive for malaise/fatigue. Negative for chills and fever.  HENT: Positive for congestion and sinus pain. Negative for ear pain, nosebleeds and sore throat.   Eyes: Negative for discharge and redness.  Respiratory: Positive for cough, shortness of breath and wheezing.   Cardiovascular: Positive for chest pain. Negative for palpitations and orthopnea.  Gastrointestinal: Negative for heartburn, nausea and vomiting.  Skin: Negative for itching and rash.    All other systems negative unless noted above in HPI  Past medical/social/surgical/family history have been reviewed  and are unchanged unless specifically indicated below.  No changes  Medication List: Allergies as of 07/22/2016      Reactions   Erythromycin Base Nausea And Vomiting      Medication List       Accurate as of 07/22/16  2:20 PM. Always use your most recent med list.          albuterol 108 (90 Base) MCG/ACT inhaler Commonly known as:  PROVENTIL HFA;VENTOLIN HFA Inhale 2 puffs into the lungs every 6 (six) hours as needed. Wheezing and shortness of breath   ALPRAZolam 1 MG tablet Commonly known as:  XANAX Take 1 tablet (1 mg total) by mouth at bedtime as needed for anxiety.   amitriptyline 50 MG tablet Commonly known as:  ELAVIL Take 50 mg by mouth as needed.   amLODipine 5 MG tablet Commonly known as:  NORVASC Take 5 mg by mouth daily.   buprenorphine 5 MCG/HR Ptwk patch Commonly known as:  BUTRANS - dosed mcg/hr Place 5 mcg onto the skin once a week. Changes patch once a week   buPROPion 300 MG 24 hr tablet Commonly known as:  WELLBUTRIN XL Take 300 mg by mouth daily.   citalopram 20 MG tablet Commonly known as:  CELEXA TAKE 1 TABLET(20 MG) BY MOUTH DAILY   cloNIDine 0.1 MG tablet Commonly known as:  CATAPRES Take 0.1 mg by mouth daily.   cloNIDine 0.1 MG tablet Commonly known as:  CATAPRES Take 0.1 mg by mouth as needed.   fluticasone 50 MCG/ACT nasal  spray Commonly known as:  FLONASE Place 2 sprays into both nostrils daily.   montelukast 10 MG tablet Commonly known as:  SINGULAIR Take 1 tablet (10 mg total) by mouth at bedtime.   olmesartan-hydrochlorothiazide 40-25 MG tablet Commonly known as:  BENICAR HCT Take 1 tablet by mouth daily.   PARoxetine Mesylate 7.5 MG Caps Commonly known as:  BRISDELLE Take 1 tablet by mouth at bedtime.   triamcinolone ointment 0.5 % Commonly known as:  KENALOG Apply 1 application topically 4 (four) times daily.       Known medication allergies: Allergies  Allergen Reactions  . Erythromycin Base Nausea And  Vomiting     Physical examination: Blood pressure (!) 146/88, pulse 72, temperature 98.2 F (36.8 C), temperature source Oral, resp. rate 14, height 5' 2.5" (1.588 m), weight 235 lb 3.2 oz (106.7 kg), SpO2 97 %.  General: Alert, interactive, in no acute distress. HEENT: TMs pearly gray, turbinates markedly edematous and pale with thick discharge, post-pharynx mildly erythematous. Tender to palpation over frontal sinus Neck: Supple without lymphadenopathy. Lungs: Clear to auscultation without wheezing, rhonchi or rales. {no increased work of breathing. CV: Normal S1, S2 without murmurs. Abdomen: Nondistended, nontender. Skin: Warm and dry, without lesions or rashes. Extremities:  No clubbing, cyanosis or edema. Neuro:   Grossly intact.  Diagnositics/Labs:  Spirometry: FEV1: 2.0L  93%, FVC: 2.35L  90%, ratio consistent with Nonobstructive pattern She was provided with a DuoNeb due to chest tightness and shortness of breath and had improvement in her symptoms her post-spirometry had a 4% change in her FEV1   Assessment and plan:   Asthma exacerbation with respiratory tract infection   - start Flovent 110mcg 2 puffs twice a day--advised to continue this throughout the winter and into spring   - use albuterol inhaler 2 puffs every 4 hours as needed for cough, wheeze, chest tightness or difficulty breathing   - depo-medrol 80mg  injection given in clinic today   - use nasal saline rinse daily prior to Flonase 2 sprays each nostril daily    - may use Mucinex 1200mg  up to twice a day drink with plenty of water to help thin/expectorate mucus   - take Augmentin 875mg  twice a day x 10 days  Follow-up in 3-4 months  I appreciate the opportunity to take part in Kristin Hahn's care. Please do not hesitate to contact me with questions.  Sincerely,   Margo AyeShaylar Padgett, MD Allergy/Immunology [facility]

## 2016-07-22 NOTE — Addendum Note (Signed)
Addended by: Lorrin MaisPADGETT, SHAYLAR P on: 07/22/2016 03:27 PM   Modules accepted: Orders

## 2016-07-22 NOTE — Patient Instructions (Addendum)
Asthma exacerbation with respiratory tract infection   - start Flovent 110mcg 2 puffs twice a day   - use albuterol inhaler 2 puffs every 4 hours as needed for cough, wheeze, chest tightness or difficulty breathing   - depo-medrol 80mg  injection given in clinic today   - use nasal saline rinse daily prior to Flonase 2 sprays each nostril daily    - may use Mucinex 1200mg  up to twice a day drink with plenty of water to help thin/expectorate mucus   - take Augmentin 875mg  twice a day x 10 days  Follow-up in 3-4 months

## 2016-08-10 ENCOUNTER — Telehealth: Payer: Self-pay | Admitting: *Deleted

## 2016-08-10 NOTE — Telephone Encounter (Signed)
Pt called to office about medication.  Return call to pt.  Pt states that she no longer has refills on Celexa- it has expired.  Pt states that she would like to try Lexapro at this time.    Please advise on Rx or send if approved.

## 2016-08-11 ENCOUNTER — Other Ambulatory Visit: Payer: Self-pay | Admitting: *Deleted

## 2016-08-11 ENCOUNTER — Other Ambulatory Visit: Payer: Self-pay | Admitting: Certified Nurse Midwife

## 2016-08-11 DIAGNOSIS — F32A Depression, unspecified: Secondary | ICD-10-CM | POA: Insufficient documentation

## 2016-08-11 DIAGNOSIS — F329 Major depressive disorder, single episode, unspecified: Secondary | ICD-10-CM | POA: Insufficient documentation

## 2016-08-11 DIAGNOSIS — F331 Major depressive disorder, recurrent, moderate: Secondary | ICD-10-CM

## 2016-08-11 DIAGNOSIS — Z7689 Persons encountering health services in other specified circumstances: Secondary | ICD-10-CM

## 2016-08-11 MED ORDER — ESCITALOPRAM OXALATE 10 MG PO TABS
10.0000 mg | ORAL_TABLET | Freq: Every day | ORAL | 12 refills | Status: DC
Start: 2016-08-11 — End: 2019-02-21

## 2016-08-11 NOTE — Telephone Encounter (Signed)
Pt made aware

## 2016-08-11 NOTE — Telephone Encounter (Signed)
Please let her know that Lexapro was sent to the pharmacy.  Thank you. R.Denney CNM

## 2016-08-11 NOTE — Progress Notes (Unsigned)
Pt would like to be referred to someone for counseling due to significant life changes.  Pt mother has cancer, pt states she and her husband are divorcing.  Referral has been entered.

## 2016-08-12 ENCOUNTER — Other Ambulatory Visit: Payer: Self-pay | Admitting: Obstetrics & Gynecology

## 2016-08-12 DIAGNOSIS — F331 Major depressive disorder, recurrent, moderate: Secondary | ICD-10-CM

## 2016-08-16 ENCOUNTER — Other Ambulatory Visit: Payer: Self-pay | Admitting: *Deleted

## 2016-08-16 ENCOUNTER — Other Ambulatory Visit: Payer: Self-pay | Admitting: Certified Nurse Midwife

## 2016-08-16 DIAGNOSIS — F320 Major depressive disorder, single episode, mild: Secondary | ICD-10-CM

## 2016-08-16 MED ORDER — ESCITALOPRAM OXALATE 5 MG PO TABS: 5.0000 mg | ORAL_TABLET | Freq: Every day | ORAL | 3 refills | Status: DC

## 2016-08-16 NOTE — Telephone Encounter (Signed)
Patient called the office today- she was afraid to take the Lexapro and flushed her Rx down the toilet. She now wants to go to [facility] and start- but has no medication and can not fill it until February. Lower dosing called in for patient to start- may double up to equal original Rx- appointment across the street made for patient.

## 2016-08-16 NOTE — Progress Notes (Unsigned)
lexapro

## 2016-08-17 ENCOUNTER — Other Ambulatory Visit: Payer: Self-pay | Admitting: Certified Nurse Midwife

## 2016-08-17 DIAGNOSIS — F339 Major depressive disorder, recurrent, unspecified: Secondary | ICD-10-CM

## 2016-08-17 NOTE — Telephone Encounter (Signed)
Please let her know that I have placed a referral to psychiatry as she will need medication/counseling management and she is not currently pregnant.  Please let her know that I am sorry for her difficulties right now.  Thank you.  R.Denney CNM

## 2016-08-17 NOTE — Telephone Encounter (Signed)
Patient may need another referral for medication management.

## 2016-08-18 ENCOUNTER — Institutional Professional Consult (permissible substitution)

## 2016-08-19 ENCOUNTER — Ambulatory Visit (INDEPENDENT_AMBULATORY_CARE_PROVIDER_SITE_OTHER): Payer: Self-pay | Admitting: Clinical

## 2016-08-19 ENCOUNTER — Other Ambulatory Visit: Payer: Self-pay | Admitting: Certified Nurse Midwife

## 2016-08-19 DIAGNOSIS — F322 Major depressive disorder, single episode, severe without psychotic features: Secondary | ICD-10-CM

## 2016-08-19 NOTE — BH Specialist Note (Signed)
Session Start time: 11:15   End Time: 12:15 Total Time:  60 minutes Type of Service: Behavioral Health - Individual/Family Interpreter: No.   Interpreter Name & Language: n/a # [facility]  SUBJECTIVE: Kristin Hahn is a 51 y.o. female  Pt. was referred by Dr Clearance CootsHarper for:  anxiety and depression. Pt. reports the following symptoms/concerns: Pt states that her primary concern is overwhelming depression and anxiety over husband ending their 30-year marriage. Pt says she needs to have BH managed; open to practical strategies to cope with current life stressors. Duration of problem:  Over nine months Severity: severe Previous treatment: Treated   OBJECTIVE: Mood: Anxious and Depressed & Affect: Tearful Risk of harm to self or others: No known risk of harm to self or others, no SI, no HI, no plan Assessments administered: PHQ9: 24/ GAD7: 21  LIFE CONTEXT:  Family & Social: Lives with husband, soon will live alone; helps 70yo mother with medical appointments, has 2 grown children School/ Work: works fulltime at [employer]; husband supports financially Self-Care: poor appetite and sleep, no exercise(pain) Life changes: End of marriage What is important to pt/family (values): Coping well with life changes  GOALS ADDRESSED:  -Reduce symptoms of anxiety and depression  INTERVENTIONS: Solution Focused and Meditation: CALM relaxation breathing exercise   ASSESSMENT:  Pt currently experiencing Major depressive disorder, severe, without psychotic features. Pt may benefit from psychoeducation and brief therapeutic interventions regarding coping with symptoms of anxiety and depression, along with referral to psychiatry.   PLAN: 1. F/U with behavioral health clinician: Two weeks 2. Behavioral Health meds: Wellbutrin 3. Behavioral recommendations:  Sports administrator-Contact insurance today, ask to send list of PCP/Psychiatry that accepts insurance -[facility]  today, to inquire about psychiatry and therapy availability -Establish with Psychiatry for med management -Establish with PCP for primary care -Consider [facility] as community resource/classes/workshops -Read educational material regarding coping with symptoms of anxiety and depression -Practice CALM relaxation breathing exercises daily -Begin "Worry Hour" strategy today, to prioritize life stressors 4. Referral: Brief Counseling/Psychotherapy, Publishing rights managerCommunity Resource, Problem-solving teaching/coping strategies, Psychoeducation and [facility]erral to [facility] 5. From scale of 1-10, how likely are you to follow plan: 9  Woc-Behavioral Health Clinician  Behavioral Health Clinician  Warmhandoff: no

## 2016-08-22 ENCOUNTER — Encounter: Payer: Self-pay | Admitting: *Deleted

## 2016-08-22 ENCOUNTER — Other Ambulatory Visit: Payer: Self-pay | Admitting: Certified Nurse Midwife

## 2016-08-22 ENCOUNTER — Encounter (HOSPITAL_COMMUNITY): Payer: Self-pay

## 2016-09-07 ENCOUNTER — Ambulatory Visit: Admitting: Family Medicine

## 2016-10-13 ENCOUNTER — Other Ambulatory Visit: Payer: Self-pay | Admitting: Family Medicine

## 2016-10-13 ENCOUNTER — Other Ambulatory Visit: Payer: Self-pay | Admitting: Certified Nurse Midwife

## 2016-10-13 DIAGNOSIS — N951 Menopausal and female climacteric states: Secondary | ICD-10-CM

## 2016-10-19 ENCOUNTER — Ambulatory Visit (HOSPITAL_COMMUNITY): Payer: Self-pay | Admitting: Psychiatry

## 2016-10-20 ENCOUNTER — Ambulatory Visit: Admitting: Allergy

## 2016-11-03 ENCOUNTER — Ambulatory Visit (HOSPITAL_COMMUNITY): Payer: Self-pay | Admitting: Psychiatry

## 2017-04-06 ENCOUNTER — Ambulatory Visit (HOSPITAL_COMMUNITY)
Admission: EM | Admit: 2017-04-06 | Discharge: 2017-04-06 | Disposition: A | Discharge status: Home / Self Care | Attending: Family Medicine | Admitting: Family Medicine

## 2017-04-06 ENCOUNTER — Encounter (HOSPITAL_COMMUNITY): Payer: Self-pay | Admitting: Nurse Practitioner

## 2017-04-06 DIAGNOSIS — M545 Low back pain: Secondary | ICD-10-CM | POA: Diagnosis not present

## 2017-04-06 DIAGNOSIS — M5489 Other dorsalgia: Secondary | ICD-10-CM

## 2017-04-06 DIAGNOSIS — M542 Cervicalgia: Secondary | ICD-10-CM | POA: Diagnosis not present

## 2017-04-06 MED ORDER — CYCLOBENZAPRINE HCL 10 MG PO TABS: 10.0000 mg | ORAL_TABLET | Freq: Every day | ORAL | 0 refills | Status: DC

## 2017-04-06 MED ORDER — NAPROXEN 500 MG PO TABS: 500.0000 mg | ORAL_TABLET | Freq: Two times a day (BID) | ORAL | 0 refills | Status: DC
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
## 2017-07-15 DIAGNOSIS — Z79899 Other long term (current) drug therapy: Secondary | ICD-10-CM | POA: Insufficient documentation

## 2017-07-15 DIAGNOSIS — I10 Essential (primary) hypertension: Secondary | ICD-10-CM | POA: Insufficient documentation

## 2017-07-15 DIAGNOSIS — R0789 Other chest pain: Secondary | ICD-10-CM | POA: Insufficient documentation

## 2017-07-15 DIAGNOSIS — R1011 Right upper quadrant pain: Secondary | ICD-10-CM | POA: Insufficient documentation

## 2017-07-15 DIAGNOSIS — J45909 Unspecified asthma, uncomplicated: Secondary | ICD-10-CM | POA: Insufficient documentation

## 2017-07-15 NOTE — ED Triage Notes (Signed)
Patient complaining of mid chest pain, pain in between shoulder blades, and right flank pain. Patient is feeling nauseas. Patient started this a week ago. Patient states she has a hx of gall bladder problems.

## 2017-07-16 ENCOUNTER — Other Ambulatory Visit: Payer: Self-pay

## 2017-07-16 ENCOUNTER — Emergency Department (HOSPITAL_COMMUNITY)

## 2017-07-16 ENCOUNTER — Emergency Department (HOSPITAL_COMMUNITY)
Admission: EM | Admit: 2017-07-16 | Discharge: 2017-07-16 | Disposition: A | Discharge status: Home / Self Care | Attending: Emergency Medicine | Admitting: Emergency Medicine

## 2017-07-16 ENCOUNTER — Encounter (HOSPITAL_COMMUNITY): Payer: Self-pay | Admitting: Emergency Medicine

## 2017-07-16 DIAGNOSIS — R1011 Right upper quadrant pain: Secondary | ICD-10-CM

## 2017-07-16 LAB — URINALYSIS, ROUTINE W REFLEX MICROSCOPIC
Glucose, UA: NEGATIVE mg/dL
Hgb urine dipstick: NEGATIVE
Ketones, ur: NEGATIVE mg/dL
Leukocytes, UA: NEGATIVE
Nitrite: NEGATIVE
Protein, ur: 30 mg/dL — AB
Specific Gravity, Urine: 1.033 — ABNORMAL HIGH (ref 1.005–1.030)
pH: 5 (ref 5.0–8.0)

## 2017-07-16 LAB — BASIC METABOLIC PANEL
Anion gap: 9 (ref 5–15)
BUN: 18 mg/dL (ref 6–20)
CO2: 25 mmol/L (ref 22–32)
Calcium: 8.8 mg/dL — ABNORMAL LOW (ref 8.9–10.3)
Chloride: 107 mmol/L (ref 101–111)
Creatinine, Ser: 0.78 mg/dL (ref 0.44–1.00)
GFR calc Af Amer: >60 mL/min (ref >60)
GFR calc non Af Amer: >60 mL/min (ref >60)
Glucose, Bld: 113 mg/dL — ABNORMAL HIGH (ref 65–99)
Potassium: 3.2 mmol/L — ABNORMAL LOW (ref 3.5–5.1)
Sodium: 141 mmol/L (ref 135–145)

## 2017-07-16 LAB — HEPATIC FUNCTION PANEL
ALT: 23 U/L (ref 14–54)
AST: 29 U/L (ref 15–41)
Albumin: 3.5 g/dL (ref 3.5–5.0)
Alkaline Phosphatase: 69 U/L (ref 38–126)
Bilirubin, Direct: 0.1 mg/dL (ref 0.1–0.5)
Indirect Bilirubin: 0.3 mg/dL (ref 0.3–0.9)
Total Bilirubin: 0.4 mg/dL (ref 0.3–1.2)
Total Protein: 6.7 g/dL (ref 6.5–8.1)

## 2017-07-16 LAB — I-STAT TROPONIN, ED
Troponin i, poc: 0 ng/mL (ref 0.00–0.08)
Troponin i, poc: 0 ng/mL (ref 0.00–0.08)

## 2017-07-16 LAB — CBC
HCT: 36.2 % (ref 36.0–46.0)
Hemoglobin: 11.3 g/dL — ABNORMAL LOW (ref 12.0–15.0)
MCH: 26.7 pg (ref 26.0–34.0)
MCHC: 31.2 g/dL (ref 30.0–36.0)
MCV: 85.6 fL (ref 78.0–100.0)
Platelets: 224 10*3/uL (ref 150–400)
RBC: 4.23 MIL/uL (ref 3.87–5.11)
RDW: 15.2 % (ref 11.5–15.5)
WBC: 9.9 10*3/uL (ref 4.0–10.5)

## 2017-07-16 LAB — LIPASE, BLOOD: Lipase: 29 U/L (ref 11–51)

## 2017-07-16 MED ORDER — SODIUM CHLORIDE 0.9 % IV BOLUS (SEPSIS)
1000.0000 mL | Freq: Once | INTRAVENOUS | Status: AC
  Administered 2017-07-16: 1000 mL via INTRAVENOUS

## 2017-07-16 MED ORDER — PANTOPRAZOLE SODIUM 20 MG PO TBEC: 20.0000 mg | DELAYED_RELEASE_TABLET | Freq: Every day | ORAL | 1 refills | Status: DC

## 2017-07-16 MED ORDER — PANTOPRAZOLE SODIUM 40 MG IV SOLR
40.0000 mg | Freq: Once | INTRAVENOUS | Status: AC
  Administered 2017-07-16: 40 mg via INTRAVENOUS
  Filled 2017-07-16: qty 40

## 2017-07-16 MED ORDER — POTASSIUM CHLORIDE CRYS ER 20 MEQ PO TBCR
40.0000 meq | EXTENDED_RELEASE_TABLET | Freq: Once | ORAL | Status: AC
  Administered 2017-07-16: 40 meq via ORAL
  Filled 2017-07-16: qty 2

## 2017-07-16 MED ORDER — FAMOTIDINE IN NACL 20-0.9 MG/50ML-% IV SOLN
20.0000 mg | Freq: Once | INTRAVENOUS | Status: AC
  Administered 2017-07-16: 20 mg via INTRAVENOUS
  Filled 2017-07-16: qty 50

## 2017-07-16 NOTE — Discharge Instructions (Signed)
Your lab results were reassuring.  There were no acute abnormalities on the imaging studies. Protonix: Please begin taking the Protonix tomorrow.  Take this medication 15-20 minutes prior to your first meal of the day. Zantac: Continue taking this medication daily. Follow-up: Please follow-up with the gastroenterologist as soon as possible. Return: Return to the ED for any worsening symptoms.

## 2017-07-16 NOTE — ED Provider Notes (Signed)
[facility]-EMERGENCY DEPT Provider Note   CSN: [id number] Arrival date & time: 07/15/17  2342     History   Chief Complaint Chief Complaint  Patient presents with  . Chest Pain    HPI Kristin Hahn is a 51 y.o. female.  HPI   Kristin Hahn is a 51 y.o. female, with a history of HTN, asthma, and kidney stones, presenting to the ED with epigastric pain intermittent for the last week.  Describes the discomfort as a fullness, 7/10, radiating to the right upper quadrant, worse with eating.  States she has had this pain before years ago with "gallbladder troubles."  States at that time she had 18% function in the gallbladder, but no stones.  Accompanied by feeling of abdominal bloating, belching, poor taste in the mouth, and light colored, loose stools.  Maintains chronic nausea. Also endorses right flank pain described as an aching, 10/10, radiating inferiorly. States she is followed by gastroenterologist Dr. Elnoria HowardHung. Denies urinary complaints, fever/chills, vomiting, hematemesis, hematochezia/melena, abnormal vaginal discharge or bleeding, or any other complaints.     Past Medical History:  Diagnosis Date  . Asthma   . Facet joint disease   . Gout   . Hypertension   . Kidney stone   . Renal disorder     Patient Active Problem List   Diagnosis Date Noted  . Depression 08/11/2016  . Perimenopausal vasomotor symptoms 08/01/2014  . Kidney stones 05/09/2014  . HTN (hypertension), malignant 05/09/2014  . Gout 05/09/2014  . Morbid obesity (HCC) 05/09/2014    Past Surgical History:  Procedure Laterality Date  . ABDOMINAL HYSTERECTOMY    . ADENOIDECTOMY    . keloid removal    . kidney stone removal    . oophrectomy     right  . TONSILLECTOMY    . TUBAL LIGATION      OB History    Gravida Para Term Preterm AB Living   2 2 2     2    SAB TAB Ectopic Multiple Live Births           2       Home Medications    Prior to Admission medications    Medication Sig Start Date End Date Taking? Authorizing Provider  albuterol (PROVENTIL HFA;VENTOLIN HFA) 108 (90 Base) MCG/ACT inhaler Inhale 2 puffs into the lungs every 4 (four) hours as needed. Wheezing and shortness of breath 07/22/16  Yes Padgett, Pilar GrammesShaylar Patricia, MD  albuterol (PROVENTIL) (2.5 MG/3ML) 0.083% nebulizer solution Take 3 mLs (2.5 mg total) by nebulization every 4 (four) hours as needed for wheezing or shortness of breath. 07/22/16  Yes Padgett, Pilar GrammesShaylar Patricia, MD  amLODipine (NORVASC) 5 MG tablet Take 5 mg by mouth daily.   Yes [provider]  buPROPion (WELLBUTRIN XL) 300 MG 24 hr tablet Take 300 mg by mouth daily. 03/24/16  Yes [provider]  cloNIDine (CATAPRES) 0.1 MG tablet Take 0.1 mg by mouth daily as needed (sleep).  04/04/12 07/16/17 Yes Earley FavorSchulz, Gail, NP  FLUoxetine (PROZAC) 40 MG capsule Take 40 mg by mouth 2 (two) times daily. 06/28/17  Yes [provider]  fluticasone (FLONASE) 50 MCG/ACT nasal spray Place 2 sprays into both nostrils daily. Patient taking differently: Place 2 sprays into both nostrils daily as needed for allergies.  07/22/16  Yes Padgett, Pilar GrammesShaylar Patricia, MD  ibuprofen (ADVIL,MOTRIN) 800 MG tablet Take 800 mg by mouth every 8 (eight) hours as needed for headache, mild pain or moderate pain.  Yes [provider]  Ibuprofen-Diphenhydramine HCl (ADVIL PM) 200-25 MG CAPS Take 2 tablets by mouth at bedtime as needed (pain sleep).   Yes [provider]  olmesartan-hydrochlorothiazide (BENICAR HCT) 40-25 MG per tablet Take 1 tablet by mouth daily.   Yes [provider]  ALPRAZolam Prudy Feeler(XANAX) 1 MG tablet Take 1 tablet (1 mg total) by mouth at bedtime as needed for anxiety. Patient not taking: Reported on 07/16/2017 06/23/16   Orvilla Cornwallenney, Rachelle A, CNM  citalopram (CELEXA) 20 MG tablet TAKE 1 TABLET(20 MG) BY MOUTH DAILY Patient not taking: Reported on 07/22/2016 05/31/16   Orvilla Cornwallenney, Rachelle A, CNM    cyclobenzaprine (FLEXERIL) 10 MG tablet Take 1 tablet (10 mg total) by mouth at bedtime. Patient not taking: Reported on 07/16/2017 04/06/17   Mardella LaymanHagler, Brian, MD  escitalopram (LEXAPRO) 10 MG tablet Take 1 tablet (10 mg total) by mouth daily. Patient not taking: Reported on 07/16/2017 08/11/16   Orvilla Cornwallenney, Rachelle A, CNM  escitalopram (LEXAPRO) 5 MG tablet Take 1 tablet (5 mg total) by mouth daily. Until dosage is adjusted to 10 mg daily Patient not taking: Reported on 07/16/2017 08/16/16   Orvilla Cornwallenney, Rachelle A, CNM  fluticasone (FLOVENT HFA) 110 MCG/ACT inhaler Inhale 2 puffs into the lungs 2 (two) times daily. Patient not taking: Reported on 07/16/2017 07/22/16   Marcelyn BruinsPadgett, Shaylar Patricia, MD  montelukast (SINGULAIR) 10 MG tablet Take 1 tablet (10 mg total) by mouth at bedtime. Patient not taking: Reported on 07/16/2017 06/18/15   Orvilla Cornwallenney, Rachelle A, CNM  naproxen (NAPROSYN) 500 MG tablet Take 1 tablet (500 mg total) by mouth 2 (two) times daily. Patient not taking: Reported on 07/16/2017 04/06/17   Mardella LaymanHagler, Brian, MD  pantoprazole (PROTONIX) 20 MG tablet Take 1 tablet (20 mg total) by mouth daily. 07/16/17 09/14/17  Joy, Shawn C, PA-C  PARoxetine Mesylate (BRISDELLE) 7.5 MG CAPS Take 1 tablet by mouth at bedtime. Patient not taking: Reported on 07/16/2017 10/06/15   Roe Coombsenney, Rachelle A, CNM    Family History Family History  Problem Relation Age of Onset  . Diabetes Mother   . Hypertension Mother   . Diabetes Father   . Hypertension Father   . Eczema Daughter   . Asthma Daughter   . Allergic rhinitis Neg Hx   . Angioedema Neg Hx   . Immunodeficiency Neg Hx   . Urticaria Neg Hx     Social History Social History   Tobacco Use  . Smoking status: Never Smoker  . Smokeless tobacco: Never Used  Substance Use Topics  . Alcohol use: No    Alcohol/week: 0.0 oz  . Drug use: No     Allergies   Erythromycin base   Review of Systems Review of Systems  Constitutional: Negative for chills,  diaphoresis and fever.  Gastrointestinal: Positive for abdominal pain, diarrhea and nausea. Negative for blood in stool and vomiting.  Genitourinary: Positive for flank pain. Negative for dysuria, hematuria, vaginal bleeding and vaginal discharge.  Neurological: Negative for dizziness and light-headedness.  All other systems reviewed and are negative.    Physical Exam Updated Vital Signs BP 107/73 (BP Location: Right Arm)   Pulse 74   Temp 97.7 F (36.5 C) (Oral)   Ht 5\' 2"  (1.575 m)   Wt 108.9 kg (240 lb)   SpO2 98%   BMI 43.90 kg/m   Physical Exam  Constitutional: She appears well-developed and well-nourished. No distress.  HENT:  Head: Normocephalic and atraumatic.  Eyes: Conjunctivae are normal.  Neck: Neck  supple.  Cardiovascular: Normal rate, regular rhythm, normal heart sounds and intact distal pulses.  Pulmonary/Chest: Effort normal and breath sounds normal. No respiratory distress.  Abdominal: Soft. There is tenderness in the right upper quadrant and epigastric area. There is no guarding.  Right flank tenderness  Musculoskeletal: She exhibits no edema.  Lymphadenopathy:    She has no cervical adenopathy.  Neurological: She is alert.  Skin: Skin is warm and dry. She is not diaphoretic.  Psychiatric: She has a normal mood and affect. Her behavior is normal.  Nursing note and vitals reviewed.    ED Treatments / Results  Labs (all labs ordered are listed, but only abnormal results are displayed) Labs Reviewed  BASIC METABOLIC PANEL - Abnormal; Notable for the following components:      Result Value   Potassium 3.2 (*)    Glucose, Bld 113 (*)    Calcium 8.8 (*)    All other components within normal limits  CBC - Abnormal; Notable for the following components:   Hemoglobin 11.3 (*)    All other components within normal limits  URINALYSIS, ROUTINE W REFLEX MICROSCOPIC - Abnormal; Notable for the following components:   APPearance HAZY (*)    Specific Gravity,  Urine 1.033 (*)    Bilirubin Urine SMALL (*)    Protein, ur 30 (*)    Bacteria, UA RARE (*)    Squamous Epithelial / LPF 0-5 (*)    All other components within normal limits  LIPASE, BLOOD  HEPATIC FUNCTION PANEL  I-STAT TROPONIN, ED  I-STAT TROPONIN, ED    EKG  EKG Interpretation  Date/Time:  Sunday July 16 2017 00:10:28 EST Ventricular Rate:  76 PR Interval:    QRS Duration: 102 QT Interval:  428 QTC Calculation: 482 R Axis:   -2 Text Interpretation:  Sinus rhythm Borderline prolonged PR interval Low voltage, precordial leads no significant change since 2012 Confirmed by Pricilla Loveless [phone]) on 07/16/2017 12:13:34 AM       Radiology Dg Chest 2 View  Result Date: 07/16/2017 CLINICAL DATA:  Mid chest pain. EXAM: CHEST  2 VIEW COMPARISON:  February 26, 2017 FINDINGS: Stable cardiomegaly. The hila, mediastinum, lungs, and pleura are otherwise normal. IMPRESSION: No active cardiopulmonary disease. Electronically Signed   By: Gerome Sam III M.D   On: 07/16/2017 00:32   US Renal  Result Date: 07/16/2017 CLINICAL DATA:  Right flank pain. EXAM: RENAL / URINARY TRACT ULTRASOUND COMPLETE COMPARISON:  None. FINDINGS: Right Kidney: Length: 12.7 cm. There is a 7.1 mm stone in the right kidney without hydronephrosis. Left Kidney: Length: 13 cm. Echogenicity within normal limits. No mass or hydronephrosis visualized. Bladder: Appears normal for degree of bladder distention. IMPRESSION: 1. Nonobstructive 7 mm stone in the right kidney. No other abnormalities. Electronically Signed   By: Gerome Sam III M.D   On: 07/16/2017 03:23   US Abdomen Limited Ruq  Result Date: 07/16/2017 CLINICAL DATA:  51 year old female with right upper quadrant pain and nausea. EXAM: ULTRASOUND ABDOMEN LIMITED RIGHT UPPER QUADRANT COMPARISON:  Abdominal CT dated 05/09/2014 FINDINGS: Gallbladder: No gallstones or wall thickening visualized. No sonographic Murphy sign noted by sonographer. Common bile duct:  Diameter: 10 mm Liver: Mild increased liver echogenicity. Portal vein is patent on color Doppler imaging with normal direction of blood flow towards the liver. IMPRESSION: Probable mild fatty liver otherwise unremarkable right upper quadrant ultrasound. Electronically Signed   By: Elgie Collard M.D.   On: 07/16/2017 03:19    Procedures Procedures (  including critical care time)  Medications Ordered in ED Medications  sodium chloride 0.9 % bolus 1,000 mL (0 mLs Intravenous Stopped 07/16/17 0418)  famotidine (PEPCID) IVPB 20 mg premix (0 mg Intravenous Stopped 07/16/17 0226)  pantoprazole (PROTONIX) injection 40 mg (40 mg Intravenous Given 07/16/17 0117)  potassium chloride SA (K-DUR,KLOR-CON) CR tablet 40 mEq (40 mEq Oral Given 07/16/17 0350)     Initial Impression / Assessment and Plan / ED Course  I have reviewed the triage vital signs and the nursing notes.  Pertinent labs & imaging results that were available during my care of the patient were reviewed by me and considered in my medical decision making (see chart for details).  Clinical Course as of Jul 16 449  Wynelle Link Jul 16, 2017  1610 [place] tech paged.  [SJ]    Clinical Course User Index [SJ] Joy, Shawn C, PA-C   Patient presents with right upper quadrant pain. Patient is nontoxic appearing, afebrile, not tachycardic, not tachypneic, not hypotensive, maintains excellent SPO2 on room air, and is in no apparent distress.  No acute abnormalities on ultrasound.  Patient had resolution in her symptoms during ED course.  GI follow-up. The patient was given instructions for home care as well as return precautions. Patient voices understanding of these instructions, accepts the plan, and is comfortable with discharge.   Findings and plan of care discussed with Pricilla Loveless, MD. Dr. Criss Alvine personally evaluated and examined this patient.  Vitals:   07/16/17 0230 07/16/17 0300 07/16/17 0400 07/16/17 0430  BP: 122/77 128/84 130/87 115/65   Pulse: 66 70 67 70  Resp: (!) 23 (!) 22 20 (!) 22  Temp:      TempSrc:      SpO2: 95% 95% 98% 97%  Weight:      Height:        Final Clinical Impressions(s) / ED Diagnoses   Final diagnoses:  RUQ pain    ED Discharge Orders        Ordered    pantoprazole (PROTONIX) 20 MG tablet  Daily     07/16/17 0416       Anselm Pancoast, PA-C 07/16/17 0450    Pricilla Loveless, MD 07/16/17 [phone]

## 2017-09-17 ENCOUNTER — Other Ambulatory Visit: Payer: Self-pay | Admitting: Certified Nurse Midwife

## 2017-09-17 DIAGNOSIS — F32A Depression, unspecified: Secondary | ICD-10-CM

## 2017-09-17 DIAGNOSIS — F419 Anxiety disorder, unspecified: Secondary | ICD-10-CM

## 2017-09-17 DIAGNOSIS — F329 Major depressive disorder, single episode, unspecified: Secondary | ICD-10-CM

## 2017-09-23 ENCOUNTER — Other Ambulatory Visit: Payer: Self-pay | Admitting: Certified Nurse Midwife

## 2017-09-23 DIAGNOSIS — L409 Psoriasis, unspecified: Secondary | ICD-10-CM

## 2017-09-25 NOTE — Telephone Encounter (Signed)
Please review for refill.  

## 2017-09-27 MED ORDER — CITALOPRAM HYDROBROMIDE 20 MG PO TABS: 20.0000 mg | ORAL_TABLET | Freq: Every day | ORAL | 2 refills | Status: DC

## 2017-09-27 NOTE — Telephone Encounter (Signed)
Left detailed message informing pt on vm 

## 2017-09-27 NOTE — Telephone Encounter (Signed)
Please have PCP manage depression.  Or needs psychiatry referral.  Thank you.   Rachelle

## 2017-12-31 ENCOUNTER — Other Ambulatory Visit: Payer: Self-pay | Admitting: Certified Nurse Midwife

## 2017-12-31 DIAGNOSIS — F329 Major depressive disorder, single episode, unspecified: Secondary | ICD-10-CM

## 2017-12-31 DIAGNOSIS — F32A Depression, unspecified: Secondary | ICD-10-CM

## 2017-12-31 DIAGNOSIS — F419 Anxiety disorder, unspecified: Secondary | ICD-10-CM

## 2018-01-01 ENCOUNTER — Other Ambulatory Visit: Payer: Self-pay | Admitting: Obstetrics

## 2018-01-01 DIAGNOSIS — F32A Depression, unspecified: Secondary | ICD-10-CM

## 2018-01-01 DIAGNOSIS — F329 Major depressive disorder, single episode, unspecified: Secondary | ICD-10-CM

## 2018-01-01 DIAGNOSIS — F419 Anxiety disorder, unspecified: Secondary | ICD-10-CM

## 2018-04-16 ENCOUNTER — Ambulatory Visit: Payer: Self-pay | Admitting: Obstetrics and Gynecology

## 2018-08-08 HISTORY — PX: SPINAL CORD STIMULATOR INSERTION: SHX5378

## 2018-11-27 ENCOUNTER — Other Ambulatory Visit: Payer: Self-pay | Admitting: Physician Assistant

## 2018-11-27 DIAGNOSIS — R209 Unspecified disturbances of skin sensation: Secondary | ICD-10-CM

## 2018-12-18 ENCOUNTER — Other Ambulatory Visit: Payer: Self-pay | Admitting: Obstetrics

## 2018-12-18 DIAGNOSIS — L409 Psoriasis, unspecified: Secondary | ICD-10-CM

## 2019-01-09 ENCOUNTER — Other Ambulatory Visit

## 2019-01-18 ENCOUNTER — Other Ambulatory Visit: Payer: Self-pay | Admitting: Rehabilitation

## 2019-01-18 DIAGNOSIS — M4180 Other forms of scoliosis, site unspecified: Secondary | ICD-10-CM

## 2019-01-19 ENCOUNTER — Ambulatory Visit
Admission: RE | Admit: 2019-01-19 | Discharge: 2019-01-19 | Disposition: A | Discharge status: Home / Self Care | Source: Ambulatory Visit | Attending: Physician Assistant | Admitting: Physician Assistant

## 2019-01-19 DIAGNOSIS — R209 Unspecified disturbances of skin sensation: Secondary | ICD-10-CM

## 2019-01-23 ENCOUNTER — Other Ambulatory Visit: Payer: Self-pay | Admitting: Nurse Practitioner

## 2019-01-23 DIAGNOSIS — Z1231 Encounter for screening mammogram for malignant neoplasm of breast: Secondary | ICD-10-CM

## 2019-01-28 ENCOUNTER — Other Ambulatory Visit: Payer: Self-pay | Admitting: Rehabilitation

## 2019-01-28 DIAGNOSIS — M419 Scoliosis, unspecified: Secondary | ICD-10-CM

## 2019-02-06 ENCOUNTER — Ambulatory Visit
Admission: RE | Admit: 2019-02-06 | Discharge: 2019-02-06 | Disposition: A | Discharge status: Home / Self Care | Source: Ambulatory Visit | Attending: Rehabilitation | Admitting: Rehabilitation

## 2019-02-06 ENCOUNTER — Other Ambulatory Visit: Payer: Self-pay

## 2019-02-06 DIAGNOSIS — M419 Scoliosis, unspecified: Secondary | ICD-10-CM

## 2019-02-16 ENCOUNTER — Ambulatory Visit
Admission: RE | Admit: 2019-02-16 | Discharge: 2019-02-16 | Disposition: A | Discharge status: Home / Self Care | Source: Ambulatory Visit | Attending: Rehabilitation | Admitting: Rehabilitation

## 2019-02-16 ENCOUNTER — Other Ambulatory Visit: Payer: Self-pay

## 2019-02-16 DIAGNOSIS — M4180 Other forms of scoliosis, site unspecified: Secondary | ICD-10-CM

## 2019-02-21 ENCOUNTER — Other Ambulatory Visit: Payer: Self-pay

## 2019-02-21 ENCOUNTER — Ambulatory Visit (INDEPENDENT_AMBULATORY_CARE_PROVIDER_SITE_OTHER): Payer: Federal, State, Local not specified - PPO | Admitting: Allergy

## 2019-02-21 ENCOUNTER — Encounter: Payer: Self-pay | Admitting: Allergy

## 2019-02-21 VITALS — BP 190/102 | HR 101 | Temp 98.2°F | Resp 16 | Ht 60.0 in | Wt 259.6 lb

## 2019-02-21 DIAGNOSIS — J4550 Severe persistent asthma, uncomplicated: Secondary | ICD-10-CM | POA: Diagnosis not present

## 2019-02-21 MED ORDER — LEVALBUTEROL TARTRATE 45 MCG/ACT IN AERO: 2.0000 | INHALATION_SPRAY | Freq: Four times a day (QID) | RESPIRATORY_TRACT | 5 refills | Status: DC | PRN

## 2019-02-21 MED ORDER — MONTELUKAST SODIUM 10 MG PO TABS: 10.0000 mg | ORAL_TABLET | Freq: Every day | ORAL | 5 refills | Status: DC

## 2019-02-21 MED ORDER — BREO ELLIPTA 200-25 MCG/INH IN AEPB: 1.0000 | INHALATION_SPRAY | Freq: Every day | RESPIRATORY_TRACT | 3 refills | Status: DC

## 2019-02-21 NOTE — Patient Instructions (Addendum)
Asthma, poorly controlled   - start Breo 262mcg 1 puff once a day   - have access to Xopenex inhaler 2 puffs every 4-6 hours as needed for cough/wheeze/shortness of breath/chest tightness.  May use 15-20 minutes prior to activity.   Monitor frequency of use.   Use with spacer device.    - start Singulair 10mg  daily at bedtime   Asthma control goals:   Full participation in all desired activities (may need albuterol before activity)  Albuterol use two time or less a week on average (not counting use with activity)  Cough interfering with sleep two time or less a month  Oral steroids no more than once a year  No hospitalizations   Follow-up in 3 months

## 2019-02-21 NOTE — Progress Notes (Signed)
Follow-up Note  RE: Kristin Boardsenny H Greenfeld MRN: [id number] DOB: Mar 07, 1966 Date of Office Visit: 02/21/2019   History of present illness: Kristin Hahn is a 53 y.o. female presenting today for follow-up of asthma.  She was last seen in the office on July 22, 2016 by myself.  She states she has returned today as her breathing has gotten out of control.  She states she is quite short of breath and states that her family can hear her wheezing.  She also states that she has some fluttering of her heart. She is not taking any asthma medicines at this time.  At the last visit she was to start Flovent but does not have any more of this.  She also is to be on Singulair but is not taking this either.  She states she does have a rescue inhaler that she is needing to use on a daily basis.  She states she would like to get her breathing under control.  She also is concerned that her difficulty breathing might also be related to her chronic pain that she is having.  She reports quite severe back pain and states that she has issues with her spine, sciatica, bursitis in her knees, pinched nerves and gout.  She states she does follow with pain medicine but at this point her pain is not controlled.  She also is not sure if her heart flutters are related to her pain.  Her pain has gotten to the point that she is now requiring a walker.    She had a ECHO yesterday that she reports was normal but does not follow with a cardiologist at this time..    Review of systems: Review of Systems  Constitutional: Negative for chills, fever and weight loss.  HENT: Negative for congestion, ear discharge, nosebleeds and sore throat.   Eyes: Negative for pain, discharge and redness.  Respiratory: Positive for cough, shortness of breath and wheezing. Negative for hemoptysis and sputum production.   Cardiovascular: Positive for palpitations.  Gastrointestinal: Negative for abdominal pain, constipation, diarrhea, nausea and  vomiting.  Musculoskeletal: Positive for back pain and joint pain.  Skin: Negative for itching and rash.  Neurological: Negative for headaches.    All other systems negative unless noted above in HPI  Past medical/social/surgical/family history have been reviewed and are unchanged unless specifically indicated below.  No changes  Medication List: Allergies as of 02/21/2019      Reactions   Erythromycin Base Nausea And Vomiting      Medication List       Accurate as of February 21, 2019 11:59 PM. If you have any questions, ask your nurse or doctor.        STOP taking these medications   ALPRAZolam 1 MG tablet Commonly known as: Xanax Stopped by: Shaylar Larose HiresPatricia Padgett, MD   citalopram 20 MG tablet Commonly known as: CELEXA Stopped by: Shaylar Larose HiresPatricia Padgett, MD   cyclobenzaprine 10 MG tablet Commonly known as: FLEXERIL Stopped by: Shaylar Larose HiresPatricia Padgett, MD   escitalopram 10 MG tablet Commonly known as: Lexapro Stopped by: Shaylar Larose HiresPatricia Padgett, MD   escitalopram 5 MG tablet Commonly known as: Lexapro Stopped by: Shaylar Larose HiresPatricia Padgett, MD   FLUoxetine 40 MG capsule Commonly known as: PROZAC Stopped by: Shaylar Larose HiresPatricia Padgett, MD   pantoprazole 20 MG tablet Commonly known as: PROTONIX Stopped by: Shaylar Larose HiresPatricia Padgett, MD   PARoxetine Mesylate 7.5 MG Caps Commonly known as: Brisdelle Stopped by: Shaylar Larose HiresPatricia Padgett, MD  TAKE these medications   Advil PM 200-25 MG Caps Generic drug: Ibuprofen-diphenhydrAMINE HCl Take 2 tablets by mouth at bedtime as needed (pain sleep).   albuterol 108 (90 Base) MCG/ACT inhaler Commonly known as: VENTOLIN HFA Inhale 2 puffs into the lungs every 4 (four) hours as needed. Wheezing and shortness of breath   albuterol (2.5 MG/3ML) 0.083% nebulizer solution Commonly known as: PROVENTIL Take 3 mLs (2.5 mg total) by nebulization every 4 (four) hours as needed for wheezing or shortness of breath.    allopurinol 300 MG tablet Commonly known as: ZYLOPRIM Take 600 mg by mouth daily.   amLODipine 5 MG tablet Commonly known as: NORVASC Take 5 mg by mouth daily.   Benicar 40 MG tablet Generic drug: olmesartan 1 tablet   Breo Ellipta 200-25 MCG/INH Aepb Generic drug: fluticasone furoate-vilanterol Inhale 1 puff into the lungs daily. Started by: Shaylar Charmian Muff, MD   buPROPion 300 MG 24 hr tablet Commonly known as: WELLBUTRIN XL Take 300 mg by mouth daily.   cloNIDine 0.1 MG tablet Commonly known as: CATAPRES Take 0.1 mg by mouth daily as needed (sleep).   diclofenac 75 MG EC tablet Commonly known as: VOLTAREN TK 1 T PO BID WF OR MILK AND WF PRN   diclofenac sodium 1 % Gel Commonly known as: VOLTAREN APP 2 -4 GRAMS AA QID PRN   fluocinonide ointment 0.05 % Commonly known as: LIDEX APP EXT AA BID   fluticasone 110 MCG/ACT inhaler Commonly known as: Flovent HFA Inhale 2 puffs into the lungs 2 (two) times daily.   fluticasone 50 MCG/ACT nasal spray Commonly known as: Flonase Place 2 sprays into both nostrils daily.   ibuprofen 800 MG tablet Commonly known as: ADVIL Take 800 mg by mouth every 8 (eight) hours as needed for headache, mild pain or moderate pain.   levalbuterol 45 MCG/ACT inhaler Commonly known as: Xopenex HFA Inhale 2 puffs into the lungs every 6 (six) hours as needed for wheezing. Started by: Shaylar Charmian Muff, MD   Linzess 145 MCG Caps capsule Generic drug: linaclotide TK ONE C PO  D PRN   montelukast 10 MG tablet Commonly known as: Singulair Take 1 tablet (10 mg total) by mouth at bedtime.   naproxen 500 MG tablet Commonly known as: NAPROSYN Take 1 tablet (500 mg total) by mouth 2 (two) times daily.   olmesartan-hydrochlorothiazide 40-25 MG tablet Commonly known as: BENICAR HCT Take 1 tablet by mouth daily.   ondansetron 8 MG tablet Commonly known as: ZOFRAN TK 1 T PO Q 8 H PRN   oxyCODONE-acetaminophen 10-325 MG  tablet Commonly known as: PERCOCET TK 1 T PO  Q 6 H AND AT BEDTIME AS NEEDED   POLYETHYLENE GLYCOL 3350 PO MIX AND DRINK 1-2 CAPFULS IN 8 OUNCES OF JUICE, WATER, OR TEA EVERY DAY AS DIRECTED   tiZANidine 4 MG tablet Commonly known as: ZANAFLEX TK 1 T PO TID PRN   traZODone 50 MG tablet Commonly known as: DESYREL TK 1 TO 2 TS PO HS   triamcinolone ointment 0.5 % Commonly known as: KENALOG Apply topically 2 (two) times daily.   Trintellix 20 MG Tabs tablet Generic drug: vortioxetine HBr TK 1 T PO D   Vitamin D (Ergocalciferol) 1.25 MG (50000 UT) Caps capsule Commonly known as: DRISDOL TK 1 C PO WEEKLY       Known medication allergies: Allergies  Allergen Reactions  . Erythromycin Base Nausea And Vomiting     Physical examination: Blood pressure Marland Kitchen)  190/102, pulse (!) 101, temperature 98.2 F (36.8 C), temperature source Temporal, resp. rate 16, height 5' (1.524 m), weight 259 lb 9.6 oz (117.8 kg), SpO2 97 %.   Pt reports having severe back pain contributing to elevated BP  General: Alert, interactive, in no acute distress, obese. HEENT: PERRLA, TMs pearly gray, turbinates mildly edematous without discharge, post-pharynx non erythematous. Neck: Supple without lymphadenopathy. Lungs: Mildly decreased breath sounds bilaterally without wheezing, rhonchi or rales. {no increased work of breathing. CV: Normal S1, S2 without murmurs. Abdomen: Nondistended, nontender. Skin: Warm and dry, without lesions or rashes. Extremities:  No clubbing, cyanosis or edema. Neuro:   Grossly intact.  Diagnositics/Labs:  Spirometry: FEV1: 1.69L 82%, FVC: 1.97L 76% predicted.  FVC slightly reduced.    Assessment and plan:   Asthma, poorly controlled   - start Breo 200mcg 1 puff once a day   - have access to Xopenex inhaler 2 puffs every 4-6 hours as needed for cough/wheeze/shortness of breath/chest tightness.  May use 15-20 minutes prior to activity.   Monitor frequency of use.   Use  with spacer device.            - changing to Xopenex due to reported palpitations   - start Singulair 10mg  daily at bedtime   Asthma control goals:   Full participation in all desired activities (may need albuterol before activity)  Albuterol use two time or less a week on average (not counting use with activity)  Cough interfering with sleep two time or less a month  Oral steroids no more than once a year  No hospitalizations   Follow-up in 3 months  I appreciate the opportunity to take part in Orella's care. Please do not hesitate to contact me with questions.  Sincerely,   Margo AyeShaylar Padgett, MD Allergy/Immunology [facility]

## 2019-05-24 ENCOUNTER — Ambulatory Visit: Admitting: Allergy

## 2019-05-30 ENCOUNTER — Ambulatory Visit: Admitting: Allergy

## 2019-06-11 ENCOUNTER — Inpatient Hospital Stay: Admission: RE | Admit: 2019-06-11 | Source: Ambulatory Visit

## 2020-01-03 ENCOUNTER — Encounter: Payer: Self-pay | Admitting: Family

## 2020-01-03 ENCOUNTER — Ambulatory Visit (INDEPENDENT_AMBULATORY_CARE_PROVIDER_SITE_OTHER): Payer: Federal, State, Local not specified - PPO | Admitting: Allergy

## 2020-01-03 ENCOUNTER — Other Ambulatory Visit: Payer: Self-pay

## 2020-01-03 ENCOUNTER — Encounter: Payer: Self-pay | Admitting: Allergy

## 2020-01-03 VITALS — BP 152/86 | HR 103 | Temp 98.2°F | Resp 16

## 2020-01-03 DIAGNOSIS — J4551 Severe persistent asthma with (acute) exacerbation: Secondary | ICD-10-CM

## 2020-01-03 DIAGNOSIS — J988 Other specified respiratory disorders: Secondary | ICD-10-CM | POA: Diagnosis not present

## 2020-01-03 DIAGNOSIS — J310 Chronic rhinitis: Secondary | ICD-10-CM

## 2020-01-03 MED ORDER — FLUTICASONE PROPIONATE 50 MCG/ACT NA SUSP: 2.0000 | Freq: Every day | NASAL | 5 refills | Status: DC

## 2020-01-03 MED ORDER — BREO ELLIPTA 200-25 MCG/INH IN AEPB: 1.0000 | INHALATION_SPRAY | Freq: Every day | RESPIRATORY_TRACT | 5 refills | Status: AC

## 2020-01-03 MED ORDER — LEVALBUTEROL TARTRATE 45 MCG/ACT IN AERO: 2.0000 | INHALATION_SPRAY | Freq: Four times a day (QID) | RESPIRATORY_TRACT | 2 refills | Status: DC | PRN

## 2020-01-03 MED ORDER — MONTELUKAST SODIUM 10 MG PO TABS: 10.0000 mg | ORAL_TABLET | Freq: Every day | ORAL | 5 refills | Status: DC

## 2020-01-03 NOTE — Progress Notes (Signed)
[address] [name] [address] Dept: [phone]  FOLLOW UP NOTE  Patient ID: Kristin Hahn, female    DOB: [birth year]  Age: 54 y.o. MRN: [id number] Date of Office Visit: 01/03/2020  Assessment  Chief Complaint: Shortness of Breath  HPI IANNA Hahn is a 54 year old female who presents for acute visit today.  She was last seen on February 21, 2019 by Dr. Delorse Lek for severe persistent asthma.  Asthma is reported as not well controlled.  About 1 to 2 weeks ago she noticed increase in wheezing, shortness of breath with exertion and rest and tightness in her chest at times.  She denies any coughing, nocturnal symptoms, use of systemic steroids or trips to the emergency room or urgent care for  breathing since her last office visit..  She also denies any fever, chills or chest pain.  Prior to this she was doing well and not using her Breo 200 mcg, but when these symptoms started she restarted her Breo.  She is only using her Singulair 10 mg once a day as needed.  For the past week she has been using her Xopenex approximately 4 times a day.  She also reports for the past 1 to 2 weeks nasal congestion in the morning and hoarseness.  Her throat was sore earlier this morning but is no longer sore.  She denies any rhinorrhea, sinus tenderness or postnasal drip.  Drug Allergies:  Allergies  Allergen Reactions  . Erythromycin Base Nausea And Vomiting    Physical Exam: BP (!) 152/86   Pulse (!) 103   Temp 98.2 F (36.8 C) (Temporal)   Resp 16   SpO2 98%    Physical Exam Constitutional:      Appearance: Normal appearance. She is well-developed.  HENT:     Head: Normocephalic and atraumatic.     Comments: Pharynx normal. Eyes normal. Ears normal. Nose: bilateral turbinates mildly edematous and erythematous.    Right Ear: Tympanic membrane, ear canal and external ear normal.     Left Ear: Tympanic membrane, ear canal and external ear normal.     Mouth/Throat:     Mouth:  Mucous membranes are moist.     Pharynx: Oropharynx is clear.  Eyes:     Conjunctiva/sclera: Conjunctivae normal.  Cardiovascular:     Rate and Rhythm: Normal rate and regular rhythm.     Heart sounds: Normal heart sounds.  Pulmonary:     Effort: Pulmonary effort is normal.     Breath sounds: Normal breath sounds.     Comments: Lungs clear to auscultation. Musculoskeletal:     Cervical back: Neck supple.  Skin:    General: Skin is warm.  Neurological:     Mental Status: She is alert and oriented to person, place, and time.  Psychiatric:        Mood and Affect: Mood normal.        Behavior: Behavior normal.        Thought Content: Thought content normal.        Judgment: Judgment normal.     Diagnostics: FVC 2.29 L, FEV1 1.87 L.  Predicted FVC 2.38 L, FEV1 1.90 L.  Spirometry indicates normal ventilatory function.  Assessment and Plan: 1. Severe persistent asthma with acute exacerbation   2. Rhinitis, unspecified type     Patient Instructions  Asthma Start prednisone 10 mg taper-take 1 tablet twice a day for 4 days then take 1 tablet once a day on the fifth day and  stop. Continue Breo 200 mcg-using 1 puff once a day to help prevent cough and wheeze.  Rinse mouth out afterwards to help prevent thrush. Continue Singulair 10 mg-take 1 tablet once a day to help prevent cough and wheeze. May use Xopenex 2 puffs every 6 hours as needed for coughing, wheezing, tightness in chest or shortness of breath.  Rhinitis Fluticasone nasal spray-use 2 sprays each nostril once a day as needed for stuffy nose.  Continue all other scheduled medications. Please let us know if this treatment plan is not working well for you.  Schedule follow-up appointment in 2 months.     Thank you for the opportunity to care for this patient.  Please do not hesitate to contact me with questions.  Althea Charon, FNP [facility]  --------------------- Attestation:  I reviewed the Nurse Practitioner's note and agree with the documented findings and plan of care. We discussed the patient and developed a plan concurrently.   Prudy Feeler, MD [facility]

## 2020-01-03 NOTE — Patient Instructions (Signed)
Asthma Start prednisone 10 mg taper-take 1 tablet twice a day for 4 days then take 1 tablet once a day on the fifth day and stop. Continue Breo 200 mcg-using 1 puff once a day to help prevent cough and wheeze.  Rinse mouth out afterwards to help prevent thrush. Continue Singulair 10 mg-take 1 tablet once a day to help prevent cough and wheeze. May use Xopenex 2 puffs every 6 hours as needed for coughing, wheezing, tightness in chest or shortness of breath.  Rhinitis Fluticasone nasal spray-use 2 sprays each nostril once a day as needed for stuffy nose.  Continue all other scheduled medications. Please let us know if this treatment plan is not working well for you.  Schedule follow-up appointment in 2 months.

## 2020-01-13 ENCOUNTER — Telehealth: Payer: Self-pay | Admitting: Allergy

## 2020-01-13 NOTE — Telephone Encounter (Signed)
Please advice  

## 2020-01-13 NOTE — Telephone Encounter (Signed)
Patient was in the office on 01/03/2020 and is still not feeling any better. Patient finished her prednisone that was given in the office on 5/28. Patient states she thinks she needs her own nebulizer as she was using her grandsons. Patient also needs a spacer because she doesn't think she is getting all of her medication. Patient doesn't know if she got a spacer when she was in the office, but she needs one even if she has to pay for it. Patient states she went to a physical on 01/10/2020 with Ellender Hose with [facility] and was told that her breathing was at 64% and that she needed to follow up with her allergist.  Patient states to call her on her cell phone, but if she does not answer then to call her work at [phone].  Please advise.

## 2020-01-14 NOTE — Telephone Encounter (Signed)
That is fine to provide with new spacer and a neb machine to use.   She does not need the spacer to use her Breo.   Her spirometry was normal at the time of her visit with use.  Please ensure she is taking her Breo daily everyday regardless of how she is feeling.  If she continues to have symptoms then she likely will need to start a asthma biologic medication.  She will need to have a CBC w diff done and an IgE.

## 2020-01-14 NOTE — Telephone Encounter (Signed)
Patient called back to check on status of message. As I was getting a nurse, patient hung up.

## 2020-01-14 NOTE — Telephone Encounter (Signed)
Left patient a message to call the office back. I left a message telling her she can pick up a new spacer and a nebulizer.

## 2020-01-20 ENCOUNTER — Other Ambulatory Visit: Payer: Self-pay

## 2020-01-20 ENCOUNTER — Telehealth: Payer: Self-pay

## 2020-01-20 DIAGNOSIS — J4521 Mild intermittent asthma with (acute) exacerbation: Secondary | ICD-10-CM

## 2020-01-20 MED ORDER — ALBUTEROL SULFATE (2.5 MG/3ML) 0.083% IN NEBU: 2.5000 mg | INHALATION_SOLUTION | RESPIRATORY_TRACT | 5 refills | Status: AC | PRN

## 2020-01-20 NOTE — Telephone Encounter (Signed)
Nebulizer and spacer given to patient's friend Music therapist) verbal consent was given through phone by patient. Medication sent in to [facility] on [address]

## 2020-01-20 NOTE — Telephone Encounter (Signed)
I accidentally gave the patient the pediatric nebulizer instead of the adult one. Patient was notified of this error and will try to stop by tomorrow so we can exchange it.

## 2020-01-20 NOTE — Telephone Encounter (Signed)
Patient called and states that she is sending her mother to pick up nebulizer and spacer around 1:30pm today in [place]. Patient states she does not have albuterol to put into the machine and would like medication sent to [facility] on [address]. Patient states if you call her cell and she does not answer, you can call her work at [phone].

## 2020-01-21 NOTE — Telephone Encounter (Signed)
Patient's daughter picked up adult nebulizer machine. Pediatric one was returned.

## 2020-03-13 ENCOUNTER — Ambulatory Visit: Admitting: Allergy

## 2020-03-17 ENCOUNTER — Other Ambulatory Visit: Payer: Self-pay

## 2020-03-17 ENCOUNTER — Encounter: Payer: Self-pay | Admitting: Obstetrics and Gynecology

## 2020-03-17 ENCOUNTER — Ambulatory Visit (INDEPENDENT_AMBULATORY_CARE_PROVIDER_SITE_OTHER): Payer: Federal, State, Local not specified - PPO | Admitting: Obstetrics and Gynecology

## 2020-03-17 VITALS — BP 156/94 | HR 99 | Ht 62.0 in | Wt 254.4 lb

## 2020-03-17 DIAGNOSIS — N951 Menopausal and female climacteric states: Secondary | ICD-10-CM | POA: Diagnosis not present

## 2020-03-17 DIAGNOSIS — Z01419 Encounter for gynecological examination (general) (routine) without abnormal findings: Secondary | ICD-10-CM

## 2020-03-17 DIAGNOSIS — R1030 Lower abdominal pain, unspecified: Secondary | ICD-10-CM | POA: Insufficient documentation

## 2020-03-17 DIAGNOSIS — R1909 Other intra-abdominal and pelvic swelling, mass and lump: Secondary | ICD-10-CM | POA: Insufficient documentation

## 2020-03-17 MED ORDER — CLONIDINE 0.1 MG/24HR TD PTWK: 0.1000 mg | MEDICATED_PATCH | TRANSDERMAL | 12 refills | Status: DC

## 2020-03-17 NOTE — Patient Instructions (Signed)
Menopause Menopause is the normal time of life when menstrual periods stop completely. It is usually confirmed by 12 months without a menstrual period. The transition to menopause (perimenopause) most often happens between the ages of 45 and 55. During perimenopause, hormone levels change in your body, which can cause symptoms and affect your health. Menopause may increase your risk for:  Loss of bone (osteoporosis), which causes bone breaks (fractures).  Depression.  Hardening and narrowing of the arteries (atherosclerosis), which can cause heart attacks and strokes. What are the causes? This condition is usually caused by a natural change in hormone levels that happens as you get older. The condition may also be caused by surgery to remove both ovaries (bilateral oophorectomy). What increases the risk? This condition is more likely to start at an earlier age if you have certain medical conditions or treatments, including:  A tumor of the pituitary gland in the brain.  A disease that affects the ovaries and hormone production.  Radiation treatment for cancer.  Certain cancer treatments, such as chemotherapy or hormone (anti-estrogen) therapy.  Heavy smoking and excessive alcohol use.  Family history of early menopause. This condition is also more likely to develop earlier in women who are very thin. What are the signs or symptoms? Symptoms of this condition include:  Hot flashes.  Irregular menstrual periods.  Night sweats.  Changes in feelings about sex. This could be a decrease in sex drive or an increased comfort around your sexuality.  Vaginal dryness and thinning of the vaginal walls. This may cause painful intercourse.  Dryness of the skin and development of wrinkles.  Headaches.  Problems sleeping (insomnia).  Mood swings or irritability.  Memory problems.  Weight gain.  Hair growth on the face and chest.  Bladder infections or problems with urinating. How  is this diagnosed? This condition is diagnosed based on your medical history, a physical exam, your age, your menstrual history, and your symptoms. Hormone tests may also be done. How is this treated? In some cases, no treatment is needed. You and your health care provider should make a decision together about whether treatment is necessary. Treatment will be based on your individual condition and preferences. Treatment for this condition focuses on managing symptoms. Treatment may include:  Menopausal hormone therapy (MHT).  Medicines to treat specific symptoms or complications.  Acupuncture.  Vitamin or herbal supplements. Before starting treatment, make sure to let your health care provider know if you have a personal or family history of:  Heart disease.  Breast cancer.  Blood clots.  Diabetes.  Osteoporosis. Follow these instructions at home: Lifestyle  Do not use any products that contain nicotine or tobacco, such as cigarettes and e-cigarettes. If you need help quitting, ask your health care provider.  Get at least 30 minutes of physical activity on 5 or more days each week.  Avoid alcoholic and caffeinated beverages, as well as spicy foods. This may help prevent hot flashes.  Get 7-8 hours of sleep each night.  If you have hot flashes, try: ? Dressing in layers. ? Avoiding things that may trigger hot flashes, such as spicy food, warm places, or stress. ? Taking slow, deep breaths when a hot flash starts. ? Keeping a fan in your home and office.  Find ways to manage stress, such as deep breathing, meditation, or journaling.  Consider going to group therapy with other women who are having menopause symptoms. Ask your health care provider about recommended group therapy meetings. Eating and   drinking  Eat a healthy, balanced diet that contains whole grains, lean protein, low-fat dairy, and plenty of fruits and vegetables.  Your health care provider may recommend  adding more soy to your diet. Foods that contain soy include tofu, tempeh, and soy milk.  Eat plenty of foods that contain calcium and vitamin D for bone health. Items that are rich in calcium include low-fat milk, yogurt, beans, almonds, sardines, broccoli, and kale. Medicines  Take over-the-counter and prescription medicines only as told by your health care provider.  Talk with your health care provider before starting any herbal supplements. If prescribed, take vitamins and supplements as told by your health care provider. These may include: ? Calcium. Women age 51 and older should get 1,200 mg (milligrams) of calcium every day. ? Vitamin D. Women need 600-800 International Units of vitamin D each day. ? Vitamins B12 and B6. Aim for 50 micrograms of B12 and 1.5 mg of B6 each day. General instructions  Keep track of your menstrual periods, including: ? When they occur. ? How heavy they are and how long they last. ? How much time passes between periods.  Keep track of your symptoms, noting when they start, how often you have them, and how long they last.  Use vaginal lubricants or moisturizers to help with vaginal dryness and improve comfort during sex.  Keep all follow-up visits as told by your health care provider. This is important. This includes any group therapy or counseling. Contact a health care provider if:  You are still having menstrual periods after age 55.  You have pain during sex.  You have not had a period for 12 months and you develop vaginal bleeding. Get help right away if:  You have: ? Severe depression. ? Excessive vaginal bleeding. ? Pain when you urinate. ? A fast or irregular heart beat (palpitations). ? Severe headaches. ? Abdomen (abdominal) pain or severe indigestion.  You fell and you think you have a broken bone.  You develop leg or chest pain.  You develop vision problems.  You feel a lump in your breast. Summary  Menopause is the normal  time of life when menstrual periods stop completely. It is usually confirmed by 12 months without a menstrual period.  The transition to menopause (perimenopause) most often happens between the ages of 45 and 55.  Symptoms can be managed through medicines, lifestyle changes, and complementary therapies such as acupuncture.  Eat a balanced diet that is rich in nutrients to promote bone health and heart health and to manage symptoms during menopause. This information is not intended to replace advice given to you by your health care provider. Make sure you discuss any questions you have with your health care provider. Document Revised: 07/07/2017 Document Reviewed: 08/27/2016 Elsevier Patient Education  2020 [employer]  

## 2020-03-17 NOTE — Progress Notes (Signed)
GYNECOLOGY ANNUAL PREVENTATIVE CARE ENCOUNTER NOTE  Subjective:   Kristin Hahn is a 54 y.o. [id number] female here for a routine annual gynecologic exam.  Current complaints: occasional suprapubic pain.  The patient also complains of worsening hot flashes and night sweats.  Denies abnormal vaginal bleeding or discharge.  The patient has not been sexually active for the past 4 years.   Gynecologic History No LMP recorded. Patient has had a hysterectomy. Patient is not sexually active  Contraception: abstinence Last Pap: 2017. Results were: normal Last mammogram: either Dec of 2020 or Jan 2021. Results were: normal  Obstetric History OB History  Gravida Para Term Preterm AB Living  2 2 2     2   SAB TAB Ectopic Multiple Live Births          2    # Outcome Date GA Lbr Len/2nd Weight Sex Delivery Anes PTL Lv  2 Term 10/23/87 [redacted]w[redacted]d  6 lb 11 oz (3.033 kg) F Vag-Spont None  LIV  1 Term 08/15/83 [redacted]w[redacted]d  7 lb 3 oz (3.26 kg) F Vag-Spont EPI  LIV    Past Medical History:  Diagnosis Date  . Asthma   . Facet joint disease   . Gout   . Hypertension   . Kidney stone   . Renal disorder     Past Surgical History:  Procedure Laterality Date  . ABDOMINAL HYSTERECTOMY    . ADENOIDECTOMY    . keloid removal    . kidney stone removal    . oophrectomy     right  . TONSILLECTOMY    . TUBAL LIGATION      Current Outpatient Medications on File Prior to Visit  Medication Sig Dispense Refill  . albuterol (PROVENTIL HFA;VENTOLIN HFA) 108 (90 Base) MCG/ACT inhaler Inhale 2 puffs into the lungs every 4 (four) hours as needed. Wheezing and shortness of breath 1 Inhaler 5  . albuterol (PROVENTIL) (2.5 MG/3ML) 0.083% nebulizer solution Take 3 mLs (2.5 mg total) by nebulization every 4 (four) hours as needed for wheezing or shortness of breath. 75 mL 5  . allopurinol (ZYLOPRIM) 300 MG tablet Take 600 mg by mouth daily.    [redacted]w[redacted]d amLODipine (NORVASC) 5 MG tablet Take 5 mg by mouth daily.    Marland Kitchen  buPROPion (WELLBUTRIN XL) 300 MG 24 hr tablet Take 300 mg by mouth daily.    . cloNIDine (CATAPRES) 0.1 MG tablet Take 0.1 mg by mouth daily as needed (sleep).     . diclofenac (VOLTAREN) 75 MG EC tablet TK 1 T PO BID WF OR MILK AND WF PRN    . diclofenac sodium (VOLTAREN) 1 % GEL APP 2 -4 GRAMS AA QID PRN    . fluocinonide ointment (LIDEX) 0.05 % APP EXT AA BID    . fluticasone (FLOVENT HFA) 110 MCG/ACT inhaler Inhale 2 puffs into the lungs 2 (two) times daily. 1 Inhaler 5  . fluticasone furoate-vilanterol (BREO ELLIPTA) 200-25 MCG/INH AEPB Inhale 1 puff into the lungs daily. 60 each 5  . ibuprofen (ADVIL,MOTRIN) 800 MG tablet Take 800 mg by mouth every 8 (eight) hours as needed for headache, mild pain or moderate pain.    Marland Kitchen levalbuterol (XOPENEX HFA) 45 MCG/ACT inhaler Inhale 2 puffs into the lungs every 6 (six) hours as needed for wheezing. 1 Inhaler 2  . LINZESS 145 MCG CAPS capsule TK ONE C PO  D PRN    . montelukast (SINGULAIR) 10 MG tablet Take 1 tablet (10 mg total)  by mouth at bedtime. 30 tablet 5  . olmesartan (BENICAR) 40 MG tablet 1 tablet    . olmesartan-hydrochlorothiazide (BENICAR HCT) 40-25 MG per tablet Take 1 tablet by mouth daily.    . ondansetron (ZOFRAN) 8 MG tablet TK 1 T PO Q 8 H PRN    . oxyCODONE-acetaminophen (PERCOCET) 10-325 MG tablet TK 1 T PO  Q 6 H AND AT BEDTIME AS NEEDED    . triamcinolone ointment (KENALOG) 0.5 % Apply topically 2 (two) times daily. 90 g 1  . TRINTELLIX 20 MG TABS tablet TK 1 T PO D    . Vitamin D, Ergocalciferol, (DRISDOL) 1.25 MG (50000 UT) CAPS capsule TK 1 C PO WEEKLY    . fluticasone (FLONASE) 50 MCG/ACT nasal spray Place 2 sprays into both nostrils daily. (Patient not taking: Reported on 03/17/2020) 16 g 5  . Ibuprofen-Diphenhydramine HCl (ADVIL PM) 200-25 MG CAPS Take 2 tablets by mouth at bedtime as needed (pain sleep). (Patient not taking: Reported on 03/17/2020)    . naproxen (NAPROSYN) 500 MG tablet Take 1 tablet (500 mg total) by mouth  2 (two) times daily. (Patient not taking: Reported on 03/17/2020) 20 tablet 0  . tiZANidine (ZANAFLEX) 4 MG tablet TK 1 T PO TID PRN (Patient not taking: Reported on 03/17/2020)    . traZODone (DESYREL) 50 MG tablet TK 1 TO 2 TS PO HS (Patient not taking: Reported on 03/17/2020)    . [DISCONTINUED] norethindrone (MICRONOR,CAMILA,ERRIN) 0.35 MG tablet Take 1 tablet (0.35 mg total) by mouth daily. (Patient not taking: Reported on 07/30/2014) 1 Package 11   No current facility-administered medications on file prior to visit.    Allergies  Allergen Reactions  . Erythromycin Base Nausea And Vomiting    Social History   Socioeconomic History  . Marital status: Married    Spouse name: Not on file  . Number of children: Not on file  . Years of education: Not on file  . Highest education level: Not on file  Occupational History  . Not on file  Tobacco Use  . Smoking status: Never Smoker  . Smokeless tobacco: Never Used  Vaping Use  . Vaping Use: Never used  Substance and Sexual Activity  . Alcohol use: No    Alcohol/week: 0.0 standard drinks  . Drug use: No  . Sexual activity: Not on file  Other Topics Concern  . Not on file  Social History Narrative  . Not on file   Social Determinants of Health   Financial Resource Strain:   . Difficulty of Paying Living Expenses:   Food Insecurity:   . Worried About Programme researcher, broadcasting/film/video in the Last Year:   . Barista in the Last Year:   Transportation Needs:   . Freight forwarder (Medical):   Marland Kitchen Lack of Transportation (Non-Medical):   Physical Activity:   . Days of Exercise per Week:   . Minutes of Exercise per Session:   Stress:   . Feeling of Stress :   Social Connections:   . Frequency of Communication with Friends and Family:   . Frequency of Social Gatherings with Friends and Family:   . Attends Religious Services:   . Active Member of Clubs or Organizations:   . Attends Banker Meetings:   Marland Kitchen Marital  Status:   Intimate Partner Violence:   . Fear of Current or Ex-Partner:   . Emotionally Abused:   Marland Kitchen Physically Abused:   . Sexually Abused:  Family History  Problem Relation Age of Onset  . Diabetes Mother   . Hypertension Mother   . Diabetes Father   . Hypertension Father   . Eczema Daughter   . Asthma Daughter   . Allergic rhinitis Neg Hx   . Angioedema Neg Hx   . Immunodeficiency Neg Hx   . Urticaria Neg Hx     The following portions of the patient's history were reviewed and updated as appropriate: allergies, current medications, past family history, past medical history, past social history, past surgical history and problem list.  Review of Systems Pertinent items noted in HPI and remainder of comprehensive ROS otherwise negative.   Objective:  BP (!) 156/94   Pulse 99   Ht 5\' 2"  (1.575 m)   Wt 254 lb 6.4 oz (115.4 kg)   BMI 46.53 kg/m  Wt Readings from Last 3 Encounters:  03/17/20 254 lb 6.4 oz (115.4 kg)  02/21/19 259 lb 9.6 oz (117.8 kg)  07/16/17 240 lb (108.9 kg)     Chaperone present during exam  CONSTITUTIONAL: Well-developed, well-nourished female in no acute distress.  HENT:  Normocephalic, atraumatic, External right and left ear normal. Oropharynx is clear and moist EYES: Conjunctivae and EOM are normal. Pupils are equal, round, and reactive to light. No scleral icterus.  NECK: Normal range of motion, supple, no masses.  Normal thyroid.   CARDIOVASCULAR: Normal heart rate noted, regular rhythm RESPIRATORY: Clear to auscultation bilaterally. Effort and breath sounds normal, no problems with respiration noted. BREASTS: Symmetric in size. No masses, skin changes, nipple drainage, or lymphadenopathy. ABDOMEN: Soft, normal bowel sounds, no distention noted.  No tenderness, rebound or guarding. A 7-8 cm, soft mass noted in the mid-suprapubic region, just below a low transverse scar from previous abdominal hysterectomy. PELVIC: Normal appearing external  genitalia; normal appearing vaginal mucosa and cervix.  No abnormal discharge noted. No cervix or uterus.  Unable to assess adnexa secondary to habitus.  MUSCULOSKELETAL: Normal range of motion. No tenderness.  No cyanosis, clubbing, or edema.  2+ distal pulses. SKIN: Skin is warm and dry. No rash noted. Not diaphoretic. No erythema. No pallor. NEUROLOGIC: Alert and oriented to person, place, and time. Normal reflexes, muscle tone coordination. No cranial nerve deficit noted. PSYCHIATRIC: Normal mood and affect. Normal behavior. Normal judgment and thought content.  Assessment:  Annual gynecologic examination with pap smear   Plan:  1. Well Woman Exam Will follow up results of pap smear and manage accordingly. Mammogram scheduled STD testing discussed. Patient declined testing Discussed exercise and diet Calcium and Vitamin D supplementation discussed  1. Well woman exam with routine gynecological exam - Pain counseled about hygiene techniques for panus, including a corn-starch based powder.  2. Abdominal wall mass of suprapubic region - Abd CT ordered.  Possible hernia.  3. Lower abdominal pain  - CT ABDOMEN PELVIS WO CONTRAST; Future  4. Menopausal symptoms - Patient to start soy supplementation. - cloNIDine (CATAPRES-TTS-1) 0.1 mg/24hr patch; Place 1 patch (0.1 mg total) onto the skin once a week.  Dispense: 4 patch; Refill: 12   Routine preventative health maintenance measures emphasized. Please refer to After Visit Summary for other counseling recommendations.    14/09/18. Darrin Nipper, MD [facility]

## 2020-03-26 ENCOUNTER — Other Ambulatory Visit

## 2020-04-08 ENCOUNTER — Other Ambulatory Visit

## 2020-04-14 ENCOUNTER — Ambulatory Visit: Admitting: Obstetrics and Gynecology

## 2020-05-14 ENCOUNTER — Encounter: Payer: Self-pay | Admitting: Allergy

## 2020-05-14 ENCOUNTER — Ambulatory Visit (INDEPENDENT_AMBULATORY_CARE_PROVIDER_SITE_OTHER): Payer: Federal, State, Local not specified - PPO | Admitting: Allergy

## 2020-05-14 ENCOUNTER — Other Ambulatory Visit: Payer: Self-pay

## 2020-05-14 DIAGNOSIS — J310 Chronic rhinitis: Secondary | ICD-10-CM

## 2020-05-14 DIAGNOSIS — J4551 Severe persistent asthma with (acute) exacerbation: Secondary | ICD-10-CM

## 2020-05-14 MED ORDER — PREDNISONE 10 MG PO TABS: ORAL_TABLET | ORAL | 0 refills | Status: AC

## 2020-05-14 MED ORDER — TRELEGY ELLIPTA 200-62.5-25 MCG/INH IN AEPB: 1.0000 | INHALATION_SPRAY | Freq: Every day | RESPIRATORY_TRACT | 2 refills | Status: DC

## 2020-05-14 NOTE — Progress Notes (Signed)
RE: Kristin Hahn MRN: [id number] DOB: 02/07/1966 Date of Telemedicine Visit: 05/14/2020  Referring provider: [facility] Primary care provider: [facility]  Chief Complaint: Asthma   Telemedicine Follow Up Visit via Telephone: I connected with Kenetra Hildenbrand for a follow up on 05/14/20 by telephone and verified that I am speaking with the correct person using two identifiers.   I discussed the limitations, risks, security and privacy concerns of performing an evaluation and management service by telephone and the availability of in person appointments. I also discussed with the patient that there may be a patient responsible charge related to this service. The patient expressed understanding and agreed to proceed.  Patient is at work Provider is at the office.  Visit start time: 112 Visit end time: 1142 Insurance consent/check in by: [facility] consent and medical assistant/nurse: Renaldo Fiddler  History of Present Illness: She is a 54 y.o. female, who is being followed for asthma and rhinitis.. Her previous allergy office visit was on 01/03/2020 with FNP Amada Jupiter..   The past 1-2 weeks she has had increase in asthma symptoms with wheezing, and shortness of breath primarily.  She is using albuteorl more now about 3 times a day at this time.  Denies any nighttime awakenings.  She is using breo 1 puff daily and singulair daily.  She believes this flareup is triggered by the changes in the weather.  She states she does not like taking prednisone as it makes her gain weight.  She also mentioned last week she noticed that her nostrils were big and red when she was looking at them in the mirror.  She started using Flonase at that time and states it did help with this congestion. She was treated for an asthma flare with prednisone at her last visit in May.  Her act score currently is 13.  Assessment and Plan: Mckenzye is a 54 y.o. female with:   Asthma with current  exacerbation This is now her second exacerbation since the summer. We will step up therapy to Trelegy 200 mcg 1 puff once a day. Stop Breo at this time. Continue Singulair 10 mg daily at bedtime. Have access to Xopenex inhaler 2 puffs every 4-6 hours as needed for cough/wheeze/shortness of breath/chest tightness.  May use 15-20 minutes prior to activity.   Monitor frequency of use.   Discussed that if she does not have improvement in her symptoms after starting Trelegy then to go ahead and take the prednisone course 20 mg twice a day for 2 days then 20 mg once a day for 2 days then 10 mg once a day and stop. We will need to obtain some lab work at her next visit to see if she will be eligible for stepup therapy with injectable agents like Nucala, Fasenra, Dupixent or Xolair  Rhinitis Fluticasone nasal spray-use 2 sprays each nostril once a day as needed for stuffy nose.  Continue all other scheduled medications. Please let us know if this treatment plan is not working well for you.  Schedule follow-up appointment in 3 months.  Diagnostics: None.  Medication List:  Current Outpatient Medications  Medication Sig Dispense Refill  . albuterol (PROVENTIL HFA;VENTOLIN HFA) 108 (90 Base) MCG/ACT inhaler Inhale 2 puffs into the lungs every 4 (four) hours as needed. Wheezing and shortness of breath 1 Inhaler 5  . albuterol (PROVENTIL) (2.5 MG/3ML) 0.083% nebulizer solution Take 3 mLs (2.5 mg total) by nebulization every 4 (four) hours as needed for wheezing  or shortness of breath. 75 mL 5  . allopurinol (ZYLOPRIM) 300 MG tablet Take 600 mg by mouth daily.    Marland Kitchen amLODipine (NORVASC) 5 MG tablet Take 5 mg by mouth daily.    Marland Kitchen buPROPion (WELLBUTRIN XL) 300 MG 24 hr tablet Take 300 mg by mouth daily.    . cloNIDine (CATAPRES) 0.1 MG tablet Take 0.1 mg by mouth daily as needed (sleep).     . cloNIDine (CATAPRES-TTS-1) 0.1 mg/24hr patch Place 1 patch (0.1 mg total) onto the skin once a week. 4 patch 12   . diclofenac (VOLTAREN) 75 MG EC tablet TK 1 T PO BID WF OR MILK AND WF PRN    . diclofenac sodium (VOLTAREN) 1 % GEL APP 2 -4 GRAMS AA QID PRN    . fluticasone (FLONASE) 50 MCG/ACT nasal spray Place 2 sprays into both nostrils daily. 16 g 5  . fluticasone furoate-vilanterol (BREO ELLIPTA) 200-25 MCG/INH AEPB Inhale 1 puff into the lungs daily. 60 each 5  . levalbuterol (XOPENEX HFA) 45 MCG/ACT inhaler Inhale 2 puffs into the lungs every 6 (six) hours as needed for wheezing. 1 Inhaler 2  . LINZESS 145 MCG CAPS capsule TK ONE C PO  D PRN    . montelukast (SINGULAIR) 10 MG tablet Take 1 tablet (10 mg total) by mouth at bedtime. 30 tablet 5  . olmesartan (BENICAR) 40 MG tablet 1 tablet    . ondansetron (ZOFRAN) 8 MG tablet TK 1 T PO Q 8 H PRN    . oxyCODONE-acetaminophen (PERCOCET) 10-325 MG tablet TK 1 T PO  Q 6 H AND AT BEDTIME AS NEEDED    . tiZANidine (ZANAFLEX) 4 MG tablet TK 1 T PO TID PRN    . triamcinolone ointment (KENALOG) 0.5 % Apply topically 2 (two) times daily. 90 g 1  . TRINTELLIX 20 MG TABS tablet TK 1 T PO D    . Vitamin D, Ergocalciferol, (DRISDOL) 1.25 MG (50000 UT) CAPS capsule TK 1 C PO WEEKLY    . Fluticasone-Umeclidin-Vilant (TRELEGY ELLIPTA) 200-62.5-25 MCG/INH AEPB Inhale 1 puff into the lungs daily. 1 each 2  . predniSONE (DELTASONE) 10 MG tablet Take 2 tablets (20 mg total) by mouth 2 (two) times daily with a meal for 2 days, THEN 1 tablet (10 mg total) 2 (two) times daily with a meal for 2 days, THEN 1 tablet (10 mg total) daily with breakfast for 1 day. 13 tablet 0   No current facility-administered medications for this visit.   Allergies: Allergies  Allergen Reactions  . Erythromycin Base Nausea And Vomiting   I reviewed her past medical history, social history, family history, and environmental history and no significant changes have been reported from previous visit on 01/03/2020.  Review of Systems  Constitutional: Negative.   HENT: Positive for congestion.    Eyes: Negative.   Respiratory: Positive for shortness of breath and wheezing.   Cardiovascular: Negative.   Gastrointestinal: Negative.   Musculoskeletal: Negative.   Skin: Negative.   Neurological: Negative.    Objective: Physical Exam Not obtained as encounter was done via telephone.   Previous notes and tests were reviewed.  I discussed the assessment and treatment plan with the patient. The patient was provided an opportunity to ask questions and all were answered. The patient agreed with the plan and demonstrated an understanding of the instructions.   The patient was advised to call back or seek an in-person evaluation if the symptoms worsen or if the condition  fails to improve as anticipated.  I provided 30 minutes of non-face-to-face time during this encounter.  It was my pleasure to participate in [place] care today. Please feel free to contact me with any questions or concerns.   Sincerely,  Chancey Ringel Larose Hires, MD

## 2020-05-14 NOTE — Patient Instructions (Addendum)
Asthma with current exacerbation This is now her second exacerbation since the summer. We will step up therapy to Trelegy 200 mcg 1 puff once a day. Stop Breo at this time. Continue Singulair 10 mg daily at bedtime. Have access to Xopenex inhaler 2 puffs every 4-6 hours as needed for cough/wheeze/shortness of breath/chest tightness.  May use 15-20 minutes prior to activity.   Monitor frequency of use.   Discussed that if she does not have improvement in her symptoms after starting Trelegy then to go ahead and take the prednisone course 20 mg twice a day for 2 days then 20 mg once a day for 2 days then 10 mg once a day and stop. We will need to obtain some lab work at her next visit to see if she will be eligible for stepup therapy with injectable agents like Nucala, Fasenra, Dupixent or Xolair  Rhinitis Fluticasone nasal spray-use 2 sprays each nostril once a day as needed for stuffy nose.  Continue all other scheduled medications. Please let us know if this treatment plan is not working well for you.  Schedule follow-up appointment in 3 months.

## 2020-08-13 DIAGNOSIS — U071 COVID-19: Secondary | ICD-10-CM | POA: Diagnosis not present

## 2020-08-13 DIAGNOSIS — R059 Cough, unspecified: Secondary | ICD-10-CM | POA: Diagnosis not present

## 2020-08-17 DIAGNOSIS — R053 Chronic cough: Secondary | ICD-10-CM | POA: Diagnosis not present

## 2020-08-17 DIAGNOSIS — R5383 Other fatigue: Secondary | ICD-10-CM | POA: Diagnosis not present

## 2020-08-17 DIAGNOSIS — Z9189 Other specified personal risk factors, not elsewhere classified: Secondary | ICD-10-CM | POA: Diagnosis not present

## 2020-08-17 DIAGNOSIS — U071 COVID-19: Secondary | ICD-10-CM | POA: Diagnosis not present

## 2020-09-02 DIAGNOSIS — M5136 Other intervertebral disc degeneration, lumbar region: Secondary | ICD-10-CM | POA: Diagnosis not present

## 2020-09-02 DIAGNOSIS — Z79891 Long term (current) use of opiate analgesic: Secondary | ICD-10-CM | POA: Diagnosis not present

## 2020-09-02 DIAGNOSIS — G894 Chronic pain syndrome: Secondary | ICD-10-CM | POA: Diagnosis not present

## 2020-09-02 DIAGNOSIS — M47896 Other spondylosis, lumbar region: Secondary | ICD-10-CM | POA: Diagnosis not present

## 2020-09-02 DIAGNOSIS — M5416 Radiculopathy, lumbar region: Secondary | ICD-10-CM | POA: Diagnosis not present

## 2020-09-14 DIAGNOSIS — M79661 Pain in right lower leg: Secondary | ICD-10-CM | POA: Diagnosis not present

## 2020-09-14 NOTE — Patient Instructions (Addendum)
Severe persistent asthma Get STAT pa/lateral chest x-ray due to cough/sensation of something in chest. We will call you with results Start prednisone 10 mg taking 2 tablets twice a day for 3 days, then on the fourth day take 2 tablets in the morning, and on the fifth day take 1 tablet in the morning and stop. Stop Trelegy 200 mcg  Continue Breo 200 mcg 1 puff once a day to help prevent cough and wheeze Start Spriva Respimat 1.25 mcg 2 puffs once day to help prevent cough and wheeze. 2 samples given for patient to try Continue Singulair 10 mg once a day to help prevent cough and wheeze Continue Xopenex 2 puffs every 4-6 hours as needed for cough, wheeze, tightness in chest, or shortness of breath.  Also may use Xopenex 2 puffs 5 to 15 minutes prior to exercise.  If still not doing well at next office visit we will get some lab to see if you qualify a biologic to help with your asthma.   Rhinitis Continue fluticasone 2 sprays each nostril once a day as needed for stuffy nose  If chest pain continues or worsens please go to the emergency room  Schedule an appointment to discuss your high blood pressure with your primary care physician.  Please let us know if this treatment plan is not working well for you Schedule a follow-up appointment in 4 weeks

## 2020-09-15 ENCOUNTER — Encounter: Payer: Self-pay | Admitting: Family

## 2020-09-15 ENCOUNTER — Ambulatory Visit (INDEPENDENT_AMBULATORY_CARE_PROVIDER_SITE_OTHER): Payer: BC Managed Care – PPO | Admitting: Family

## 2020-09-15 ENCOUNTER — Other Ambulatory Visit: Payer: Self-pay

## 2020-09-15 VITALS — BP 148/88 | HR 111 | Temp 97.0°F | Resp 18 | Ht 62.0 in | Wt 259.4 lb

## 2020-09-15 DIAGNOSIS — J4551 Severe persistent asthma with (acute) exacerbation: Secondary | ICD-10-CM

## 2020-09-15 DIAGNOSIS — R059 Cough, unspecified: Secondary | ICD-10-CM | POA: Diagnosis not present

## 2020-09-15 DIAGNOSIS — J310 Chronic rhinitis: Secondary | ICD-10-CM

## 2020-09-15 NOTE — Progress Notes (Signed)
[address] [name] [address] Dept: [phone]  FOLLOW UP NOTE  Patient ID: Kristin Hahn, female    DOB: Sep 24, 1965  Age: 55 y.o. MRN: [id number] Date of Office Visit: 09/15/2020  Assessment  Chief Complaint: Asthma (Wheezing,chest tightness, SOB, and Cough - since Saturday //Has used rescue inhaler 2-3 times a day )  HPI Kristin Hahn 55 year old female who presents today for follow-up of severe persistent asthma and rhinitis.  She was last seen on May 14, 2020 by Dr. Delorse Lek.  Severe persistent asthma is reported as not well controlled with Breo 1 puff once a day, Singulair 10 mg once a day, and Xopenex as needed.  She stopped using Trelegy 200 mcg 1 puff a day after 2 weeks of trying it.  She felt that it was too strong and made her feel bad.  Her head would hurt, she felt dizzy, and nauseous. She reports this past Friday or Saturday she began having shortness of breath, tightness in her chest, wheezing, and a cough that is at times dry and at times productive.  When her cough is productive it is clear in color. She denies any fevers or chills.  She also reports a feeling of something in the left side of her chest.  This has been going on for 3 days.  Recommended her getting a stat chest x-ray today she reports that she is not able to go today.  She denies any fevers or chills.  Since her last office visit she has had 1 round of steroids in December from her primary care physician.  Since her last office visit she has not required any trips to the emergency room or urgent care due to breathing problems.  Since Friday or Saturday she has been using her Xopenex inhaler 3 times a day.  Rhinitis is reported as controlled with fluticasone nasal spray as needed.  She denies any rhinorrhea, nasal congestion, and postnasal drip.   Drug Allergies:  Allergies  Allergen Reactions  . Erythromycin Base Nausea And Vomiting    Review of Systems: Review of Systems   Constitutional: Negative for chills and fever.  HENT:       Denies rhinorrhea, post nasal drip and nasal congesiton  Eyes:       Denies itchy watery eyes  Respiratory: Positive for cough, shortness of breath and wheezing.   Cardiovascular: Positive for chest pain. Negative for palpitations.  Gastrointestinal: Negative for abdominal pain and heartburn.  Genitourinary: Negative for dysuria.  Skin: Positive for itching and rash.  Neurological: Positive for headaches.       Reports headache today    Physical Exam: BP (!) 148/88   Pulse (!) 111   Temp (!) 97 F (36.1 C)   Resp 18   Ht 5\' 2"  (1.575 m)   Wt 259 lb 6.4 oz (117.7 kg)   SpO2 98%   BMI 47.44 kg/m    Physical Exam Constitutional:      Appearance: Normal appearance.  HENT:     Head: Normocephalic and atraumatic.     Comments: Pharynx normal, eyes normal, ears normal, nose normal    Right Ear: Tympanic membrane, ear canal and external ear normal.     Left Ear: Tympanic membrane, ear canal and external ear normal.     Nose: Nose normal.     Mouth/Throat:     Mouth: Mucous membranes are moist.     Pharynx: Oropharynx is clear.  Eyes:     Conjunctiva/sclera:  Conjunctivae normal.  Cardiovascular:     Rate and Rhythm: Regular rhythm.     Heart sounds: Normal heart sounds.  Pulmonary:     Effort: Pulmonary effort is normal.     Breath sounds: Normal breath sounds.     Comments: Lungs clear to auscultation Musculoskeletal:     Cervical back: Neck supple.  Skin:    General: Skin is warm.  Neurological:     Mental Status: She is alert and oriented to person, place, and time.  Psychiatric:        Mood and Affect: Mood normal.        Behavior: Behavior normal.        Thought Content: Thought content normal.        Judgment: Judgment normal.     Diagnostics: FVC 2.20 L, FEV1 1.80 L.  Predicted FVC 2.57 L, FEV1 2.04 L.  Spirometry indicates normal ventilatory function.  Status post bronchodilator response shows  FVC 2.09 L, FEV1 1.71 L.  Spirometry indicates normal ventilatory function with no significant bronchodilator response.  Assessment and Plan: 1. Severe persistent asthma with acute exacerbation   2. Other rhinitis   3. Cough     No orders of the defined types were placed in this encounter.   Patient Instructions  Severe persistent asthma Get STAT pa/lateral chest x-ray due to cough/sensation of something in chest. We will call you with results Start prednisone 10 mg taking 2 tablets twice a day for 3 days, then on the fourth day take 2 tablets in the morning, and on the fifth day take 1 tablet in the morning and stop. Stop Trelegy 200 mcg  Continue Breo 200 mcg 1 puff once a day to help prevent cough and wheeze Start Spriva Respimat 1.25 mcg 2 puffs once day to help prevent cough and wheeze. 2 samples given for patient to try Continue Singulair 10 mg once a day to help prevent cough and wheeze Continue Xopenex 2 puffs every 4-6 hours as needed for cough, wheeze, tightness in chest, or shortness of breath.  Also may use Xopenex 2 puffs 5 to 15 minutes prior to exercise.  If still not doing well at next office visit we will get some lab to see if you qualify a biologic to help with your asthma.   Rhinitis Continue fluticasone 2 sprays each nostril once a day as needed for stuffy nose  If chest pain continues or worsens please go to the emergency room  Schedule an appointment to discuss your high blood pressure with your primary care physician.  Please let us know if this treatment plan is not working well for you Schedule a follow-up appointment in 4 weeks   Return in about 4 weeks (around 10/13/2020), or if symptoms worsen or fail to improve.    Thank you for the opportunity to care for this patient.  Please do not hesitate to contact me with questions.  Nehemiah Settle, FNP [facility]

## 2020-09-15 NOTE — Addendum Note (Signed)
Addended by: Robet Leu A on: 09/15/2020 03:48 PM   Modules accepted: Orders

## 2020-09-16 ENCOUNTER — Telehealth: Payer: Self-pay

## 2020-09-16 NOTE — Telephone Encounter (Signed)
-----   Message from Nehemiah Settle, FNP sent at 09/16/2020  8:30 AM EST ----- Please call the patient and see if she went yesterday to get her chest x-ray.  Thank you, Nehemiah Settle, FNP

## 2020-09-16 NOTE — Telephone Encounter (Signed)
Thank you :)

## 2020-09-16 NOTE — Telephone Encounter (Signed)
Called and spoke to patient and patient stated that she did not go to get the Xray done yesterday but she will go today and call us when it is complete.We did discuss her medication regimen as patient was confused. Once we discussed her treatment plan patient verbalized understanding.

## 2020-09-18 ENCOUNTER — Other Ambulatory Visit: Payer: Self-pay

## 2020-09-18 ENCOUNTER — Ambulatory Visit
Admission: RE | Admit: 2020-09-18 | Discharge: 2020-09-18 | Disposition: A | Discharge status: Home / Self Care | Payer: BC Managed Care – PPO | Source: Ambulatory Visit | Attending: Family | Admitting: Family

## 2020-09-18 DIAGNOSIS — R059 Cough, unspecified: Secondary | ICD-10-CM | POA: Diagnosis not present

## 2020-09-18 DIAGNOSIS — R0602 Shortness of breath: Secondary | ICD-10-CM | POA: Diagnosis not present

## 2020-09-18 MED ORDER — SPIRIVA RESPIMAT 1.25 MCG/ACT IN AERS
2.0000 | INHALATION_SPRAY | Freq: Every day | RESPIRATORY_TRACT | 3 refills | Status: DC
Start: 2020-09-18 — End: 2022-11-16

## 2020-09-18 NOTE — Progress Notes (Signed)
Please let Brizeida know that patient know that her chest x-ray is normal. Thank you!

## 2020-09-18 NOTE — Progress Notes (Signed)
Great! Please send in a prescription for Spiriva Respimat 1.25 mcg 2 puffs once a day. Quantity #1 with 3 refils. Thank you

## 2020-10-14 ENCOUNTER — Ambulatory Visit: Admitting: Family Medicine

## 2020-10-17 DIAGNOSIS — M7989 Other specified soft tissue disorders: Secondary | ICD-10-CM | POA: Diagnosis not present

## 2020-10-17 DIAGNOSIS — M545 Low back pain, unspecified: Secondary | ICD-10-CM | POA: Diagnosis not present

## 2020-10-17 DIAGNOSIS — E559 Vitamin D deficiency, unspecified: Secondary | ICD-10-CM | POA: Diagnosis not present

## 2020-10-17 DIAGNOSIS — Z6841 Body Mass Index (BMI) 40.0 and over, adult: Secondary | ICD-10-CM | POA: Diagnosis not present

## 2020-10-17 DIAGNOSIS — Z1159 Encounter for screening for other viral diseases: Secondary | ICD-10-CM | POA: Diagnosis not present

## 2020-10-17 DIAGNOSIS — E538 Deficiency of other specified B group vitamins: Secondary | ICD-10-CM | POA: Diagnosis not present

## 2020-10-17 DIAGNOSIS — R7303 Prediabetes: Secondary | ICD-10-CM | POA: Diagnosis not present

## 2020-10-17 DIAGNOSIS — M129 Arthropathy, unspecified: Secondary | ICD-10-CM | POA: Diagnosis not present

## 2020-10-17 DIAGNOSIS — E7800 Pure hypercholesterolemia, unspecified: Secondary | ICD-10-CM | POA: Diagnosis not present

## 2020-10-19 DIAGNOSIS — R600 Localized edema: Secondary | ICD-10-CM | POA: Diagnosis not present

## 2020-10-20 DIAGNOSIS — M791 Myalgia, unspecified site: Secondary | ICD-10-CM | POA: Diagnosis not present

## 2020-10-20 DIAGNOSIS — M47896 Other spondylosis, lumbar region: Secondary | ICD-10-CM | POA: Diagnosis not present

## 2020-10-20 DIAGNOSIS — Z79891 Long term (current) use of opiate analgesic: Secondary | ICD-10-CM | POA: Diagnosis not present

## 2020-10-20 DIAGNOSIS — M5416 Radiculopathy, lumbar region: Secondary | ICD-10-CM | POA: Diagnosis not present

## 2020-10-20 DIAGNOSIS — M5136 Other intervertebral disc degeneration, lumbar region: Secondary | ICD-10-CM | POA: Diagnosis not present

## 2020-10-21 DIAGNOSIS — Z1231 Encounter for screening mammogram for malignant neoplasm of breast: Secondary | ICD-10-CM | POA: Diagnosis not present

## 2020-11-05 DIAGNOSIS — M5106 Intervertebral disc disorders with myelopathy, lumbar region: Secondary | ICD-10-CM | POA: Diagnosis not present

## 2020-11-05 DIAGNOSIS — M545 Low back pain, unspecified: Secondary | ICD-10-CM | POA: Diagnosis not present

## 2020-11-27 ENCOUNTER — Other Ambulatory Visit: Payer: Self-pay | Admitting: Pain Medicine

## 2020-11-27 DIAGNOSIS — M5136 Other intervertebral disc degeneration, lumbar region: Secondary | ICD-10-CM

## 2020-11-27 DIAGNOSIS — M47896 Other spondylosis, lumbar region: Secondary | ICD-10-CM

## 2020-11-27 DIAGNOSIS — M5416 Radiculopathy, lumbar region: Secondary | ICD-10-CM

## 2020-12-05 ENCOUNTER — Other Ambulatory Visit: Payer: BC Managed Care – PPO

## 2020-12-06 DIAGNOSIS — Z760 Encounter for issue of repeat prescription: Secondary | ICD-10-CM | POA: Diagnosis not present

## 2020-12-06 DIAGNOSIS — H00014 Hordeolum externum left upper eyelid: Secondary | ICD-10-CM | POA: Diagnosis not present

## 2020-12-06 DIAGNOSIS — E538 Deficiency of other specified B group vitamins: Secondary | ICD-10-CM | POA: Diagnosis not present

## 2020-12-06 DIAGNOSIS — R632 Polyphagia: Secondary | ICD-10-CM | POA: Diagnosis not present

## 2020-12-06 DIAGNOSIS — Z6841 Body Mass Index (BMI) 40.0 and over, adult: Secondary | ICD-10-CM | POA: Diagnosis not present

## 2020-12-16 DIAGNOSIS — M5416 Radiculopathy, lumbar region: Secondary | ICD-10-CM | POA: Diagnosis not present

## 2020-12-16 DIAGNOSIS — M47896 Other spondylosis, lumbar region: Secondary | ICD-10-CM | POA: Diagnosis not present

## 2020-12-16 DIAGNOSIS — M5136 Other intervertebral disc degeneration, lumbar region: Secondary | ICD-10-CM | POA: Diagnosis not present

## 2020-12-16 DIAGNOSIS — Z79891 Long term (current) use of opiate analgesic: Secondary | ICD-10-CM | POA: Diagnosis not present

## 2020-12-31 ENCOUNTER — Other Ambulatory Visit: Payer: Self-pay | Admitting: Internal Medicine

## 2020-12-31 DIAGNOSIS — R7303 Prediabetes: Secondary | ICD-10-CM | POA: Diagnosis not present

## 2020-12-31 DIAGNOSIS — J4520 Mild intermittent asthma, uncomplicated: Secondary | ICD-10-CM | POA: Diagnosis not present

## 2020-12-31 DIAGNOSIS — R5383 Other fatigue: Secondary | ICD-10-CM | POA: Diagnosis not present

## 2020-12-31 DIAGNOSIS — E559 Vitamin D deficiency, unspecified: Secondary | ICD-10-CM | POA: Diagnosis not present

## 2020-12-31 DIAGNOSIS — N3080 Other cystitis without hematuria: Secondary | ICD-10-CM | POA: Diagnosis not present

## 2020-12-31 DIAGNOSIS — M1A9XX0 Chronic gout, unspecified, without tophus (tophi): Secondary | ICD-10-CM | POA: Diagnosis not present

## 2020-12-31 DIAGNOSIS — Z79899 Other long term (current) drug therapy: Secondary | ICD-10-CM | POA: Diagnosis not present

## 2020-12-31 DIAGNOSIS — I10 Essential (primary) hypertension: Secondary | ICD-10-CM | POA: Diagnosis not present

## 2021-01-01 LAB — CBC WITH DIFFERENTIAL/PLATELET
Absolute Monocytes: 689 cells/uL (ref 200–950)
Basophils Absolute: 66 cells/uL (ref 0–200)
Basophils Relative: 0.8 %
Eosinophils Absolute: 91 cells/uL (ref 15–500)
Eosinophils Relative: 1.1 %
HCT: 37 % (ref 35.0–45.0)
Hemoglobin: 11.7 g/dL (ref 11.7–15.5)
Lymphs Abs: 3561 cells/uL (ref 850–3900)
MCH: 27.1 pg (ref 27.0–33.0)
MCHC: 31.6 g/dL — ABNORMAL LOW (ref 32.0–36.0)
MCV: 85.6 fL (ref 80.0–100.0)
MPV: 12.4 fL (ref 7.5–12.5)
Monocytes Relative: 8.3 %
Neutro Abs: 3893 cells/uL (ref 1500–7800)
Neutrophils Relative %: 46.9 %
Platelets: 221 10*3/uL (ref 140–400)
RBC: 4.32 10*6/uL (ref 3.80–5.10)
RDW: 13.3 % (ref 11.0–15.0)
Total Lymphocyte: 42.9 %
WBC: 8.3 10*3/uL (ref 3.8–10.8)

## 2021-01-01 LAB — URINE CULTURE
MICRO NUMBER:: 11938769
SPECIMEN QUALITY:: ADEQUATE

## 2021-01-01 LAB — LIPID PANEL
Cholesterol: 146 mg/dL (ref <200)
HDL: 40 mg/dL — ABNORMAL LOW (ref >=50)
LDL Cholesterol (Calc): 87 mg/dL (calc)
Non-HDL Cholesterol (Calc): 106 mg/dL (calc) (ref <130)
Total CHOL/HDL Ratio: 3.7 (calc) (ref <5.0)
Triglycerides: 97 mg/dL (ref <150)

## 2021-01-01 LAB — COMPLETE METABOLIC PANEL WITH GFR
AG Ratio: 1.3 (calc) (ref 1.0–2.5)
ALT: 24 U/L (ref 6–29)
AST: 23 U/L (ref 10–35)
Albumin: 4 g/dL (ref 3.6–5.1)
Alkaline phosphatase (APISO): 63 U/L (ref 37–153)
BUN: 23 mg/dL (ref 7–25)
CO2: 25 mmol/L (ref 20–32)
Calcium: 9.7 mg/dL (ref 8.6–10.4)
Chloride: 107 mmol/L (ref 98–110)
Creat: 1.02 mg/dL (ref 0.50–1.05)
GFR, Est African American: 72 mL/min/{1.73_m2} (ref >=60)
GFR, Est Non African American: 62 mL/min/{1.73_m2} (ref >=60)
Globulin: 3.1 g/dL (calc) (ref 1.9–3.7)
Glucose, Bld: 93 mg/dL (ref 65–99)
Potassium: 3.4 mmol/L — ABNORMAL LOW (ref 3.5–5.3)
Sodium: 143 mmol/L (ref 135–146)
Total Bilirubin: 0.3 mg/dL (ref 0.2–1.2)
Total Protein: 7.1 g/dL (ref 6.1–8.1)

## 2021-01-01 LAB — URIC ACID: Uric Acid, Serum: 9.7 mg/dL — ABNORMAL HIGH (ref 2.5–7.0)

## 2021-01-01 LAB — FOLATE: Folate: 7.8 ng/mL

## 2021-01-01 LAB — VITAMIN D 25 HYDROXY (VIT D DEFICIENCY, FRACTURES): Vit D, 25-Hydroxy: 32 ng/mL (ref 30–100)

## 2021-01-01 LAB — TSH: TSH: 0.73 mIU/L

## 2021-01-01 LAB — VITAMIN B12: Vitamin B-12: 588 pg/mL (ref 200–1100)

## 2021-01-09 DIAGNOSIS — F331 Major depressive disorder, recurrent, moderate: Secondary | ICD-10-CM | POA: Diagnosis not present

## 2021-01-09 DIAGNOSIS — F419 Anxiety disorder, unspecified: Secondary | ICD-10-CM | POA: Diagnosis not present

## 2021-01-12 DIAGNOSIS — Z79891 Long term (current) use of opiate analgesic: Secondary | ICD-10-CM | POA: Diagnosis not present

## 2021-01-12 DIAGNOSIS — Z5181 Encounter for therapeutic drug level monitoring: Secondary | ICD-10-CM | POA: Diagnosis not present

## 2021-01-12 DIAGNOSIS — M791 Myalgia, unspecified site: Secondary | ICD-10-CM | POA: Diagnosis not present

## 2021-01-14 DIAGNOSIS — J4520 Mild intermittent asthma, uncomplicated: Secondary | ICD-10-CM | POA: Diagnosis not present

## 2021-01-14 DIAGNOSIS — G894 Chronic pain syndrome: Secondary | ICD-10-CM | POA: Diagnosis not present

## 2021-01-14 DIAGNOSIS — M1A9XX0 Chronic gout, unspecified, without tophus (tophi): Secondary | ICD-10-CM | POA: Diagnosis not present

## 2021-01-14 DIAGNOSIS — I10 Essential (primary) hypertension: Secondary | ICD-10-CM | POA: Diagnosis not present

## 2021-01-16 ENCOUNTER — Ambulatory Visit
Admission: RE | Admit: 2021-01-16 | Discharge: 2021-01-16 | Disposition: A | Discharge status: Home / Self Care | Payer: BC Managed Care – PPO | Source: Ambulatory Visit | Attending: Pain Medicine | Admitting: Pain Medicine

## 2021-01-16 ENCOUNTER — Other Ambulatory Visit: Payer: BC Managed Care – PPO

## 2021-01-16 ENCOUNTER — Other Ambulatory Visit: Payer: Self-pay

## 2021-01-16 DIAGNOSIS — M5136 Other intervertebral disc degeneration, lumbar region: Secondary | ICD-10-CM

## 2021-01-16 DIAGNOSIS — M47896 Other spondylosis, lumbar region: Secondary | ICD-10-CM

## 2021-01-16 DIAGNOSIS — M5416 Radiculopathy, lumbar region: Secondary | ICD-10-CM

## 2021-01-18 DIAGNOSIS — F332 Major depressive disorder, recurrent severe without psychotic features: Secondary | ICD-10-CM | POA: Diagnosis not present

## 2021-01-21 ENCOUNTER — Other Ambulatory Visit: Payer: Self-pay | Admitting: Internal Medicine

## 2021-01-21 DIAGNOSIS — R911 Solitary pulmonary nodule: Secondary | ICD-10-CM

## 2021-01-22 ENCOUNTER — Other Ambulatory Visit: Payer: Self-pay | Admitting: Pain Medicine

## 2021-01-22 DIAGNOSIS — M5416 Radiculopathy, lumbar region: Secondary | ICD-10-CM

## 2021-01-22 DIAGNOSIS — F419 Anxiety disorder, unspecified: Secondary | ICD-10-CM | POA: Diagnosis not present

## 2021-01-22 DIAGNOSIS — M47896 Other spondylosis, lumbar region: Secondary | ICD-10-CM

## 2021-01-22 DIAGNOSIS — F331 Major depressive disorder, recurrent, moderate: Secondary | ICD-10-CM | POA: Diagnosis not present

## 2021-01-22 DIAGNOSIS — M5136 Other intervertebral disc degeneration, lumbar region: Secondary | ICD-10-CM

## 2021-01-30 ENCOUNTER — Other Ambulatory Visit: Payer: Self-pay

## 2021-01-30 ENCOUNTER — Ambulatory Visit
Admission: RE | Admit: 2021-01-30 | Discharge: 2021-01-30 | Disposition: A | Discharge status: Home / Self Care | Payer: BC Managed Care – PPO | Source: Ambulatory Visit | Attending: Pain Medicine | Admitting: Pain Medicine

## 2021-01-30 DIAGNOSIS — M5136 Other intervertebral disc degeneration, lumbar region: Secondary | ICD-10-CM

## 2021-01-30 DIAGNOSIS — M47896 Other spondylosis, lumbar region: Secondary | ICD-10-CM

## 2021-01-30 DIAGNOSIS — M5416 Radiculopathy, lumbar region: Secondary | ICD-10-CM

## 2021-02-05 DIAGNOSIS — F332 Major depressive disorder, recurrent severe without psychotic features: Secondary | ICD-10-CM | POA: Diagnosis not present

## 2021-02-09 DIAGNOSIS — M25561 Pain in right knee: Secondary | ICD-10-CM | POA: Diagnosis not present

## 2021-02-09 DIAGNOSIS — M5416 Radiculopathy, lumbar region: Secondary | ICD-10-CM | POA: Diagnosis not present

## 2021-02-09 DIAGNOSIS — M47896 Other spondylosis, lumbar region: Secondary | ICD-10-CM | POA: Diagnosis not present

## 2021-02-09 DIAGNOSIS — Z79891 Long term (current) use of opiate analgesic: Secondary | ICD-10-CM | POA: Diagnosis not present

## 2021-02-09 DIAGNOSIS — G894 Chronic pain syndrome: Secondary | ICD-10-CM | POA: Diagnosis not present

## 2021-02-09 DIAGNOSIS — M5136 Other intervertebral disc degeneration, lumbar region: Secondary | ICD-10-CM | POA: Diagnosis not present

## 2021-02-10 ENCOUNTER — Other Ambulatory Visit: Payer: BC Managed Care – PPO

## 2021-02-10 DIAGNOSIS — F331 Major depressive disorder, recurrent, moderate: Secondary | ICD-10-CM | POA: Diagnosis not present

## 2021-02-10 DIAGNOSIS — F419 Anxiety disorder, unspecified: Secondary | ICD-10-CM | POA: Diagnosis not present

## 2021-02-18 DIAGNOSIS — M4180 Other forms of scoliosis, site unspecified: Secondary | ICD-10-CM | POA: Diagnosis not present

## 2021-02-18 DIAGNOSIS — M549 Dorsalgia, unspecified: Secondary | ICD-10-CM | POA: Diagnosis not present

## 2021-02-18 DIAGNOSIS — M5441 Lumbago with sciatica, right side: Secondary | ICD-10-CM | POA: Diagnosis not present

## 2021-02-18 DIAGNOSIS — M4317 Spondylolisthesis, lumbosacral region: Secondary | ICD-10-CM | POA: Diagnosis not present

## 2021-02-18 DIAGNOSIS — Z6841 Body Mass Index (BMI) 40.0 and over, adult: Secondary | ICD-10-CM | POA: Diagnosis not present

## 2021-02-18 DIAGNOSIS — M4316 Spondylolisthesis, lumbar region: Secondary | ICD-10-CM | POA: Diagnosis not present

## 2021-02-18 DIAGNOSIS — M419 Scoliosis, unspecified: Secondary | ICD-10-CM | POA: Diagnosis not present

## 2021-02-18 DIAGNOSIS — G8929 Other chronic pain: Secondary | ICD-10-CM | POA: Diagnosis not present

## 2021-02-24 ENCOUNTER — Other Ambulatory Visit (HOSPITAL_BASED_OUTPATIENT_CLINIC_OR_DEPARTMENT_OTHER): Payer: Self-pay

## 2021-02-24 DIAGNOSIS — R0683 Snoring: Secondary | ICD-10-CM

## 2021-02-24 DIAGNOSIS — G4710 Hypersomnia, unspecified: Secondary | ICD-10-CM

## 2021-02-24 DIAGNOSIS — R5383 Other fatigue: Secondary | ICD-10-CM

## 2021-02-25 ENCOUNTER — Other Ambulatory Visit: Payer: BC Managed Care – PPO

## 2021-03-02 DIAGNOSIS — F331 Major depressive disorder, recurrent, moderate: Secondary | ICD-10-CM | POA: Diagnosis not present

## 2021-03-02 DIAGNOSIS — F419 Anxiety disorder, unspecified: Secondary | ICD-10-CM | POA: Diagnosis not present

## 2021-03-09 ENCOUNTER — Inpatient Hospital Stay: Admission: RE | Admit: 2021-03-09 | Payer: BC Managed Care – PPO | Source: Ambulatory Visit

## 2021-03-13 ENCOUNTER — Other Ambulatory Visit: Payer: Self-pay | Admitting: Allergy

## 2021-03-24 DIAGNOSIS — M79604 Pain in right leg: Secondary | ICD-10-CM | POA: Diagnosis not present

## 2021-03-24 DIAGNOSIS — M79605 Pain in left leg: Secondary | ICD-10-CM | POA: Diagnosis not present

## 2021-03-24 DIAGNOSIS — R2689 Other abnormalities of gait and mobility: Secondary | ICD-10-CM | POA: Diagnosis not present

## 2021-03-24 DIAGNOSIS — M5459 Other low back pain: Secondary | ICD-10-CM | POA: Diagnosis not present

## 2021-03-27 DIAGNOSIS — M79604 Pain in right leg: Secondary | ICD-10-CM | POA: Diagnosis not present

## 2021-03-27 DIAGNOSIS — M5442 Lumbago with sciatica, left side: Secondary | ICD-10-CM | POA: Diagnosis not present

## 2021-03-27 DIAGNOSIS — M5431 Sciatica, right side: Secondary | ICD-10-CM | POA: Diagnosis not present

## 2021-03-27 DIAGNOSIS — M1990 Unspecified osteoarthritis, unspecified site: Secondary | ICD-10-CM | POA: Diagnosis not present

## 2021-04-07 DIAGNOSIS — M791 Myalgia, unspecified site: Secondary | ICD-10-CM | POA: Diagnosis not present

## 2021-04-07 DIAGNOSIS — Z79891 Long term (current) use of opiate analgesic: Secondary | ICD-10-CM | POA: Diagnosis not present

## 2021-04-07 DIAGNOSIS — M5416 Radiculopathy, lumbar region: Secondary | ICD-10-CM | POA: Diagnosis not present

## 2021-04-07 DIAGNOSIS — M5136 Other intervertebral disc degeneration, lumbar region: Secondary | ICD-10-CM | POA: Diagnosis not present

## 2021-04-07 DIAGNOSIS — M47896 Other spondylosis, lumbar region: Secondary | ICD-10-CM | POA: Diagnosis not present

## 2021-04-07 DIAGNOSIS — G894 Chronic pain syndrome: Secondary | ICD-10-CM | POA: Diagnosis not present

## 2021-04-08 DIAGNOSIS — I4891 Unspecified atrial fibrillation: Secondary | ICD-10-CM | POA: Diagnosis not present

## 2021-04-11 DIAGNOSIS — R0602 Shortness of breath: Secondary | ICD-10-CM | POA: Diagnosis not present

## 2021-04-11 DIAGNOSIS — R7303 Prediabetes: Secondary | ICD-10-CM | POA: Diagnosis not present

## 2021-04-11 DIAGNOSIS — R5383 Other fatigue: Secondary | ICD-10-CM | POA: Diagnosis not present

## 2021-04-11 DIAGNOSIS — E559 Vitamin D deficiency, unspecified: Secondary | ICD-10-CM | POA: Diagnosis not present

## 2021-04-11 DIAGNOSIS — E8881 Metabolic syndrome: Secondary | ICD-10-CM | POA: Diagnosis not present

## 2021-04-11 DIAGNOSIS — R350 Frequency of micturition: Secondary | ICD-10-CM | POA: Diagnosis not present

## 2021-04-11 DIAGNOSIS — M129 Arthropathy, unspecified: Secondary | ICD-10-CM | POA: Diagnosis not present

## 2021-04-11 DIAGNOSIS — Z114 Encounter for screening for human immunodeficiency virus [HIV]: Secondary | ICD-10-CM | POA: Diagnosis not present

## 2021-04-11 DIAGNOSIS — Z0000 Encounter for general adult medical examination without abnormal findings: Secondary | ICD-10-CM | POA: Diagnosis not present

## 2021-04-11 DIAGNOSIS — Z1339 Encounter for screening examination for other mental health and behavioral disorders: Secondary | ICD-10-CM | POA: Diagnosis not present

## 2021-04-11 DIAGNOSIS — Z6841 Body Mass Index (BMI) 40.0 and over, adult: Secondary | ICD-10-CM | POA: Diagnosis not present

## 2021-04-13 DIAGNOSIS — M5459 Other low back pain: Secondary | ICD-10-CM | POA: Diagnosis not present

## 2021-04-13 DIAGNOSIS — M79604 Pain in right leg: Secondary | ICD-10-CM | POA: Diagnosis not present

## 2021-04-13 DIAGNOSIS — R2689 Other abnormalities of gait and mobility: Secondary | ICD-10-CM | POA: Diagnosis not present

## 2021-04-13 DIAGNOSIS — M79605 Pain in left leg: Secondary | ICD-10-CM | POA: Diagnosis not present

## 2021-04-15 DIAGNOSIS — M79604 Pain in right leg: Secondary | ICD-10-CM | POA: Diagnosis not present

## 2021-04-15 DIAGNOSIS — M5459 Other low back pain: Secondary | ICD-10-CM | POA: Diagnosis not present

## 2021-04-15 DIAGNOSIS — M79605 Pain in left leg: Secondary | ICD-10-CM | POA: Diagnosis not present

## 2021-04-15 DIAGNOSIS — R2689 Other abnormalities of gait and mobility: Secondary | ICD-10-CM | POA: Diagnosis not present

## 2021-04-20 DIAGNOSIS — M79604 Pain in right leg: Secondary | ICD-10-CM | POA: Diagnosis not present

## 2021-04-20 DIAGNOSIS — R2689 Other abnormalities of gait and mobility: Secondary | ICD-10-CM | POA: Diagnosis not present

## 2021-04-20 DIAGNOSIS — M79605 Pain in left leg: Secondary | ICD-10-CM | POA: Diagnosis not present

## 2021-04-20 DIAGNOSIS — M5459 Other low back pain: Secondary | ICD-10-CM | POA: Diagnosis not present

## 2021-04-27 DIAGNOSIS — M5459 Other low back pain: Secondary | ICD-10-CM | POA: Diagnosis not present

## 2021-04-27 DIAGNOSIS — M79604 Pain in right leg: Secondary | ICD-10-CM | POA: Diagnosis not present

## 2021-04-27 DIAGNOSIS — M79605 Pain in left leg: Secondary | ICD-10-CM | POA: Diagnosis not present

## 2021-04-27 DIAGNOSIS — R2689 Other abnormalities of gait and mobility: Secondary | ICD-10-CM | POA: Diagnosis not present

## 2021-04-29 DIAGNOSIS — M79604 Pain in right leg: Secondary | ICD-10-CM | POA: Diagnosis not present

## 2021-04-29 DIAGNOSIS — M79605 Pain in left leg: Secondary | ICD-10-CM | POA: Diagnosis not present

## 2021-04-29 DIAGNOSIS — M5459 Other low back pain: Secondary | ICD-10-CM | POA: Diagnosis not present

## 2021-04-29 DIAGNOSIS — R2689 Other abnormalities of gait and mobility: Secondary | ICD-10-CM | POA: Diagnosis not present

## 2021-05-13 DIAGNOSIS — R2689 Other abnormalities of gait and mobility: Secondary | ICD-10-CM | POA: Diagnosis not present

## 2021-05-13 DIAGNOSIS — M79604 Pain in right leg: Secondary | ICD-10-CM | POA: Diagnosis not present

## 2021-05-13 DIAGNOSIS — M5459 Other low back pain: Secondary | ICD-10-CM | POA: Diagnosis not present

## 2021-05-13 DIAGNOSIS — M79605 Pain in left leg: Secondary | ICD-10-CM | POA: Diagnosis not present

## 2021-05-15 ENCOUNTER — Telehealth: Payer: BC Managed Care – PPO

## 2021-05-15 ENCOUNTER — Encounter: Payer: Self-pay | Admitting: Nurse Practitioner

## 2021-05-21 DIAGNOSIS — M5136 Other intervertebral disc degeneration, lumbar region: Secondary | ICD-10-CM | POA: Diagnosis not present

## 2021-05-21 DIAGNOSIS — I10 Essential (primary) hypertension: Secondary | ICD-10-CM | POA: Diagnosis not present

## 2021-05-21 DIAGNOSIS — M1A9XX0 Chronic gout, unspecified, without tophus (tophi): Secondary | ICD-10-CM | POA: Diagnosis not present

## 2021-05-25 DIAGNOSIS — M5459 Other low back pain: Secondary | ICD-10-CM | POA: Diagnosis not present

## 2021-05-25 DIAGNOSIS — M79604 Pain in right leg: Secondary | ICD-10-CM | POA: Diagnosis not present

## 2021-05-25 DIAGNOSIS — R2689 Other abnormalities of gait and mobility: Secondary | ICD-10-CM | POA: Diagnosis not present

## 2021-05-25 DIAGNOSIS — M79605 Pain in left leg: Secondary | ICD-10-CM | POA: Diagnosis not present

## 2021-05-27 DIAGNOSIS — M5459 Other low back pain: Secondary | ICD-10-CM | POA: Diagnosis not present

## 2021-05-27 DIAGNOSIS — R2689 Other abnormalities of gait and mobility: Secondary | ICD-10-CM | POA: Diagnosis not present

## 2021-05-27 DIAGNOSIS — M79604 Pain in right leg: Secondary | ICD-10-CM | POA: Diagnosis not present

## 2021-05-27 DIAGNOSIS — M79605 Pain in left leg: Secondary | ICD-10-CM | POA: Diagnosis not present

## 2021-05-31 DIAGNOSIS — R600 Localized edema: Secondary | ICD-10-CM | POA: Diagnosis not present

## 2021-05-31 DIAGNOSIS — J4520 Mild intermittent asthma, uncomplicated: Secondary | ICD-10-CM | POA: Diagnosis not present

## 2021-05-31 DIAGNOSIS — I10 Essential (primary) hypertension: Secondary | ICD-10-CM | POA: Diagnosis not present

## 2021-06-01 DIAGNOSIS — M79605 Pain in left leg: Secondary | ICD-10-CM | POA: Diagnosis not present

## 2021-06-01 DIAGNOSIS — R2689 Other abnormalities of gait and mobility: Secondary | ICD-10-CM | POA: Diagnosis not present

## 2021-06-01 DIAGNOSIS — M79604 Pain in right leg: Secondary | ICD-10-CM | POA: Diagnosis not present

## 2021-06-01 DIAGNOSIS — M5459 Other low back pain: Secondary | ICD-10-CM | POA: Diagnosis not present

## 2021-06-07 DIAGNOSIS — G894 Chronic pain syndrome: Secondary | ICD-10-CM | POA: Diagnosis not present

## 2021-06-07 DIAGNOSIS — M5136 Other intervertebral disc degeneration, lumbar region: Secondary | ICD-10-CM | POA: Diagnosis not present

## 2021-06-07 DIAGNOSIS — Z79891 Long term (current) use of opiate analgesic: Secondary | ICD-10-CM | POA: Diagnosis not present

## 2021-06-07 DIAGNOSIS — M5416 Radiculopathy, lumbar region: Secondary | ICD-10-CM | POA: Diagnosis not present

## 2021-06-07 DIAGNOSIS — M47896 Other spondylosis, lumbar region: Secondary | ICD-10-CM | POA: Diagnosis not present

## 2021-06-08 DIAGNOSIS — M79605 Pain in left leg: Secondary | ICD-10-CM | POA: Diagnosis not present

## 2021-06-08 DIAGNOSIS — M5459 Other low back pain: Secondary | ICD-10-CM | POA: Diagnosis not present

## 2021-06-08 DIAGNOSIS — M79604 Pain in right leg: Secondary | ICD-10-CM | POA: Diagnosis not present

## 2021-06-08 DIAGNOSIS — R2689 Other abnormalities of gait and mobility: Secondary | ICD-10-CM | POA: Diagnosis not present

## 2021-06-09 DIAGNOSIS — I10 Essential (primary) hypertension: Secondary | ICD-10-CM | POA: Diagnosis not present

## 2021-06-09 DIAGNOSIS — R600 Localized edema: Secondary | ICD-10-CM | POA: Diagnosis not present

## 2021-06-09 DIAGNOSIS — M1A9XX0 Chronic gout, unspecified, without tophus (tophi): Secondary | ICD-10-CM | POA: Diagnosis not present

## 2021-06-09 DIAGNOSIS — J4520 Mild intermittent asthma, uncomplicated: Secondary | ICD-10-CM | POA: Diagnosis not present

## 2021-06-15 DIAGNOSIS — I10 Essential (primary) hypertension: Secondary | ICD-10-CM | POA: Diagnosis not present

## 2021-06-15 DIAGNOSIS — J4520 Mild intermittent asthma, uncomplicated: Secondary | ICD-10-CM | POA: Diagnosis not present

## 2021-06-15 DIAGNOSIS — R7303 Prediabetes: Secondary | ICD-10-CM | POA: Diagnosis not present

## 2021-06-15 DIAGNOSIS — E0842 Diabetes mellitus due to underlying condition with diabetic polyneuropathy: Secondary | ICD-10-CM | POA: Diagnosis not present

## 2021-06-15 DIAGNOSIS — Z0000 Encounter for general adult medical examination without abnormal findings: Secondary | ICD-10-CM | POA: Diagnosis not present

## 2021-06-16 NOTE — Progress Notes (Deleted)
FOLLOW UP Date of Service/Encounter:  06/16/21   Subjective:  Kristin Hahn (DOB: 08-22-1965) is a 54 y.o. female who [facility] in re-evaluation of the following: *** History obtained from: chart review and {Persons; PED relatives w/patient:19415::"patient"}.  For Review, LV was on 09/15/20  with Nehemiah Settle, FNP seen for severe persistent asthma (Breo, Singulair, Xopenex-failed Trelegy because it made her feel bad (headache, dizziness) and allergic rhinitis controlled with fluticasone as needed.  Given prednisone taper at last visit due to exacerbation.  ---------------------------------------------------      Allergies as of 06/17/2021       Reactions   Erythromycin Base Nausea And Vomiting        Medication List        Accurate as of June 16, 2021  8:56 PM. If you have any questions, ask your nurse or doctor.          albuterol 108 (90 Base) MCG/ACT inhaler Commonly known as: VENTOLIN HFA Inhale 2 puffs into the lungs every 4 (four) hours as needed. Wheezing and shortness of breath   albuterol (2.5 MG/3ML) 0.083% nebulizer solution Commonly known as: PROVENTIL Take 3 mLs (2.5 mg total) by nebulization every 4 (four) hours as needed for wheezing or shortness of breath.   allopurinol 300 MG tablet Commonly known as: ZYLOPRIM Take 600 mg by mouth daily.   amLODipine 5 MG tablet Commonly known as: NORVASC Take 5 mg by mouth daily.   Breo Ellipta 200-25 MCG/ACT Aepb Generic drug: fluticasone furoate-vilanterol Inhale 1 puff into the lungs daily.   buPROPion 300 MG 24 hr tablet Commonly known as: WELLBUTRIN XL Take 300 mg by mouth daily.   cloNIDine 0.1 MG tablet Commonly known as: CATAPRES Take 0.1 mg by mouth daily as needed (sleep).   cloNIDine 0.1 mg/24hr patch Commonly known as: Catapres-TTS-1 Place 1 patch (0.1 mg total) onto the skin once a week.   diclofenac 75 MG EC tablet Commonly known  as: VOLTAREN TK 1 T PO BID WF OR MILK AND WF PRN   diclofenac sodium 1 % Gel Commonly known as: VOLTAREN APP 2 -4 GRAMS AA QID PRN   fluticasone 50 MCG/ACT nasal spray Commonly known as: Flonase Place 2 sprays into both nostrils daily.   levalbuterol 45 MCG/ACT inhaler Commonly known as: Xopenex HFA Inhale 2 puffs into the lungs every 6 (six) hours as needed for wheezing.   Linzess 145 MCG Caps capsule Generic drug: linaclotide TK ONE C PO  D PRN   montelukast 10 MG tablet Commonly known as: Singulair Take 1 tablet (10 mg total) by mouth at bedtime.   olmesartan 40 MG tablet Commonly known as: BENICAR 1 tablet   ondansetron 8 MG tablet Commonly known as: ZOFRAN TK 1 T PO Q 8 H PRN   oxyCODONE-acetaminophen 10-325 MG tablet Commonly known as: PERCOCET TK 1 T PO  Q 6 H AND AT BEDTIME AS NEEDED   Spiriva Respimat 1.25 MCG/ACT Aers Generic drug: Tiotropium Bromide Monohydrate Inhale 2 puffs into the lungs daily.   tiZANidine 4 MG tablet Commonly known as: ZANAFLEX TK 1 T PO TID PRN   Trelegy Ellipta 200-62.5-25 MCG/ACT Aepb Generic drug: Fluticasone-Umeclidin-Vilant Inhale 1 puff into the lungs daily.   triamcinolone ointment 0.5 % Commonly known as: KENALOG Apply topically 2 (two) times daily.   Trintellix 20 MG Tabs tablet Generic drug: vortioxetine HBr TK 1 T PO D   Vitamin D (Ergocalciferol) 1.25 MG (50000  UNIT) Caps capsule Commonly known as: DRISDOL TK 1 C PO WEEKLY       Past Medical History:  Diagnosis Date   Asthma    Facet joint disease    Gout    Hypertension    Kidney stone    Renal disorder    Past Surgical History:  Procedure Laterality Date   ABDOMINAL HYSTERECTOMY     ADENOIDECTOMY     keloid removal     kidney stone removal     oophrectomy     right   TONSILLECTOMY     TUBAL LIGATION     Otherwise, there have been no changes to her past medical history, surgical history, family history, or social history.  ROS: All  others negative except as noted per HPI.   Objective:  There were no vitals taken for this visit. There is no height or weight on file to calculate BMI. Physical Exam: General Appearance:  Alert, cooperative, no distress, appears stated age  Head:  Normocephalic, without obvious abnormality, atraumatic  Eyes:  Conjunctiva clear, EOM's intact  Nose: Nares normal  Throat: Lips, tongue normal; teeth and gums normal  Neck: Supple, symmetrical  Lungs:   Respirations unlabored, no coughing  Heart:  Appears well perfused  Extremities: No edema  Skin: Skin color, texture, turgor normal, no rashes or lesions on visualized portions of skin  Neurologic: No gross deficits   Reviewed: ***  Spirometry:  Tracings reviewed. Her effort: {Blank single:19197::"Good reproducible efforts.","It was hard to get consistent efforts and there is a question as to whether this reflects a maximal maneuver.","Poor effort, data can not be interpreted."} FVC: ***L FEV1: ***L, ***% predicted FEV1/FVC ratio: ***% Interpretation: {Blank single:19197::"Spirometry consistent with mild obstructive disease","Spirometry consistent with moderate obstructive disease","Spirometry consistent with severe obstructive disease","Spirometry consistent with possible restrictive disease","Spirometry consistent with mixed obstructive and restrictive disease","Spirometry uninterpretable due to technique","Spirometry consistent with normal pattern","No overt abnormalities noted given today's efforts"}.  Please see scanned spirometry results for details.  Skin Testing: {Blank single:19197::"Select foods","Environmental allergy panel","Environmental allergy panel and select foods","Food allergy panel","None","Deferred due to recent antihistamines use"}. Positive test to: ***. Negative test to: ***.  Results discussed with patient/family.   {Blank single:19197::"Allergy testing results were read and interpreted by myself, documented by  clinical staff."," "}  Assessment/Plan  There are no Patient Instructions on file for this visit.  No follow-ups on file.  Tonny Bollman, MD  [facility]

## 2021-06-17 ENCOUNTER — Ambulatory Visit: Payer: BC Managed Care – PPO | Admitting: Internal Medicine

## 2021-06-17 DIAGNOSIS — M79605 Pain in left leg: Secondary | ICD-10-CM | POA: Diagnosis not present

## 2021-06-17 DIAGNOSIS — R0602 Shortness of breath: Secondary | ICD-10-CM | POA: Diagnosis not present

## 2021-06-17 DIAGNOSIS — M5459 Other low back pain: Secondary | ICD-10-CM | POA: Diagnosis not present

## 2021-06-17 DIAGNOSIS — J45901 Unspecified asthma with (acute) exacerbation: Secondary | ICD-10-CM | POA: Diagnosis not present

## 2021-06-17 DIAGNOSIS — M79604 Pain in right leg: Secondary | ICD-10-CM | POA: Diagnosis not present

## 2021-06-17 DIAGNOSIS — R2689 Other abnormalities of gait and mobility: Secondary | ICD-10-CM | POA: Diagnosis not present

## 2021-06-18 ENCOUNTER — Other Ambulatory Visit: Payer: Self-pay | Admitting: Family

## 2021-06-19 NOTE — Telephone Encounter (Signed)
Please send a 30 day courtesy refill. She needs to schedule a follow up appointment.

## 2021-06-20 DIAGNOSIS — T859XXS Unspecified complication of internal prosthetic device, implant and graft, sequela: Secondary | ICD-10-CM | POA: Diagnosis not present

## 2021-06-20 DIAGNOSIS — G4730 Sleep apnea, unspecified: Secondary | ICD-10-CM | POA: Diagnosis not present

## 2021-06-20 DIAGNOSIS — I10 Essential (primary) hypertension: Secondary | ICD-10-CM | POA: Diagnosis not present

## 2021-06-21 DIAGNOSIS — I10 Essential (primary) hypertension: Secondary | ICD-10-CM | POA: Diagnosis not present

## 2021-06-29 DIAGNOSIS — R2689 Other abnormalities of gait and mobility: Secondary | ICD-10-CM | POA: Diagnosis not present

## 2021-06-29 DIAGNOSIS — M5459 Other low back pain: Secondary | ICD-10-CM | POA: Diagnosis not present

## 2021-06-29 DIAGNOSIS — R7303 Prediabetes: Secondary | ICD-10-CM | POA: Diagnosis not present

## 2021-06-29 DIAGNOSIS — I10 Essential (primary) hypertension: Secondary | ICD-10-CM | POA: Diagnosis not present

## 2021-06-29 DIAGNOSIS — M79605 Pain in left leg: Secondary | ICD-10-CM | POA: Diagnosis not present

## 2021-06-29 DIAGNOSIS — J4520 Mild intermittent asthma, uncomplicated: Secondary | ICD-10-CM | POA: Diagnosis not present

## 2021-06-29 DIAGNOSIS — M79604 Pain in right leg: Secondary | ICD-10-CM | POA: Diagnosis not present

## 2021-06-30 DIAGNOSIS — E6609 Other obesity due to excess calories: Secondary | ICD-10-CM | POA: Diagnosis not present

## 2021-06-30 DIAGNOSIS — M419 Scoliosis, unspecified: Secondary | ICD-10-CM | POA: Diagnosis not present

## 2021-06-30 DIAGNOSIS — Z1211 Encounter for screening for malignant neoplasm of colon: Secondary | ICD-10-CM | POA: Diagnosis not present

## 2021-07-13 DIAGNOSIS — R2689 Other abnormalities of gait and mobility: Secondary | ICD-10-CM | POA: Diagnosis not present

## 2021-07-13 DIAGNOSIS — M79605 Pain in left leg: Secondary | ICD-10-CM | POA: Diagnosis not present

## 2021-07-13 DIAGNOSIS — M5459 Other low back pain: Secondary | ICD-10-CM | POA: Diagnosis not present

## 2021-07-13 DIAGNOSIS — M79604 Pain in right leg: Secondary | ICD-10-CM | POA: Diagnosis not present

## 2021-07-15 ENCOUNTER — Other Ambulatory Visit: Payer: Self-pay | Admitting: Internal Medicine

## 2021-07-15 DIAGNOSIS — I10 Essential (primary) hypertension: Secondary | ICD-10-CM | POA: Diagnosis not present

## 2021-07-15 DIAGNOSIS — R070 Pain in throat: Secondary | ICD-10-CM | POA: Diagnosis not present

## 2021-07-15 DIAGNOSIS — J101 Influenza due to other identified influenza virus with other respiratory manifestations: Secondary | ICD-10-CM | POA: Diagnosis not present

## 2021-07-15 DIAGNOSIS — R062 Wheezing: Secondary | ICD-10-CM | POA: Diagnosis not present

## 2021-07-15 DIAGNOSIS — J302 Other seasonal allergic rhinitis: Secondary | ICD-10-CM | POA: Diagnosis not present

## 2021-07-15 DIAGNOSIS — R7303 Prediabetes: Secondary | ICD-10-CM | POA: Diagnosis not present

## 2021-07-15 DIAGNOSIS — J4520 Mild intermittent asthma, uncomplicated: Secondary | ICD-10-CM | POA: Diagnosis not present

## 2021-07-16 LAB — INFLUENZA A AND B AG, IMMUNOASSAY
INFLUENZA A ANTIGEN: NOT DETECTED
INFLUENZA B ANTIGEN: NOT DETECTED
MICRO NUMBER:: 12732128
SOURCE:: 0
SPECIMEN QUALITY:: ADEQUATE

## 2021-07-29 DIAGNOSIS — Z1211 Encounter for screening for malignant neoplasm of colon: Secondary | ICD-10-CM | POA: Diagnosis not present

## 2021-07-29 DIAGNOSIS — K5730 Diverticulosis of large intestine without perforation or abscess without bleeding: Secondary | ICD-10-CM | POA: Diagnosis not present

## 2021-08-14 DIAGNOSIS — E8881 Metabolic syndrome: Secondary | ICD-10-CM | POA: Diagnosis not present

## 2021-08-14 DIAGNOSIS — F5081 Binge eating disorder: Secondary | ICD-10-CM | POA: Diagnosis not present

## 2021-08-14 DIAGNOSIS — R7303 Prediabetes: Secondary | ICD-10-CM | POA: Diagnosis not present

## 2021-08-14 DIAGNOSIS — Z6841 Body Mass Index (BMI) 40.0 and over, adult: Secondary | ICD-10-CM | POA: Diagnosis not present

## 2021-08-25 DIAGNOSIS — M5136 Other intervertebral disc degeneration, lumbar region: Secondary | ICD-10-CM | POA: Diagnosis not present

## 2021-08-25 DIAGNOSIS — M47896 Other spondylosis, lumbar region: Secondary | ICD-10-CM | POA: Diagnosis not present

## 2021-08-25 DIAGNOSIS — Z79891 Long term (current) use of opiate analgesic: Secondary | ICD-10-CM | POA: Diagnosis not present

## 2021-08-25 DIAGNOSIS — M5416 Radiculopathy, lumbar region: Secondary | ICD-10-CM | POA: Diagnosis not present

## 2021-08-25 DIAGNOSIS — G894 Chronic pain syndrome: Secondary | ICD-10-CM | POA: Diagnosis not present

## 2021-08-27 ENCOUNTER — Ambulatory Visit: Payer: BC Managed Care – PPO | Admitting: Allergy

## 2021-08-27 ENCOUNTER — Ambulatory Visit: Payer: BC Managed Care – PPO | Admitting: Family Medicine

## 2021-08-27 DIAGNOSIS — J309 Allergic rhinitis, unspecified: Secondary | ICD-10-CM

## 2021-08-27 NOTE — Progress Notes (Deleted)
° °  [address] [place] [address] Dept: [phone]  FOLLOW UP NOTE  Patient ID: Kristin Hahn, female    DOB: [birth year]  Age: 56 y.o. MRN: [id number] Date of Office Visit: 08/27/2021  Assessment  Chief Complaint: No chief complaint on file.  HPI Kristin Hahn is a 56 year old female who presents the clinic for follow-up visit.  She was last seen in this clinic on 09/15/2020 by Nehemiah Settle, FNP, for evaluation of asthma, allergic rhinitis, and cough.  Her last environmental allergy skin testing was on 10/04/2011 was positive to mold mix #4.   Drug Allergies:  Allergies  Allergen Reactions   Erythromycin Base Nausea And Vomiting    Physical Exam: There were no vitals taken for this visit.   Physical Exam  Diagnostics:    Assessment and Plan: No diagnosis found.  No orders of the defined types were placed in this encounter.   There are no Patient Instructions on file for this visit.  No follow-ups on file.    Thank you for the opportunity to care for this patient.  Please do not hesitate to contact me with questions.  Thermon Leyland, FNP [facility]

## 2021-08-27 NOTE — Patient Instructions (Incomplete)
Severe persistent asthma Continue Breo 200 mcg 1 puff once a day to help prevent cough and wheeze Continue Spriva Respimat 1.25 mcg 2 puffs once day to help prevent cough and wheeze. 2 samples given for patient to try Continue Singulair 10 mg once a day to help prevent cough and wheeze Continue Xopenex 2 puffs every 4-6 hours as needed for cough, wheeze, tightness in chest, or shortness of breath.  Also may use Xopenex 2 puffs 5 to 15 minutes prior to exercise.  If still not doing well at next office visit we will get some lab to see if you qualify a biologic to help with your asthma.   Allergic rhinitis Continue allergen avoidance measures directed toward mold as listed below Continue fluticasone 2 sprays each nostril once a day as needed for stuffy nose.  In the right nostril, point the applicator out toward the right ear. In the left nostril, point the applicator out toward the left ear Consider saline nasal rinses as needed for nasal symptoms. Use this before any medicated nasal sprays for best result Consider updating your environmental allergy testing when it is convenient for you.  Remember to stop antihistamines for 3 days before the testing appointment   Schedule an appointment to discuss your high blood pressure with your primary care physician.  Call the clinic if this treatment plan is not working well for you  Follow up in *** or sooner if needed.  Control of Mold Allergen Mold and fungi can grow on a variety of surfaces provided certain temperature and moisture conditions exist.  Outdoor molds grow on plants, decaying vegetation and soil.  The major outdoor mold, Alternaria and Cladosporium, are found in very high numbers during hot and dry conditions.  Generally, a late Summer - Fall peak is seen for common outdoor fungal spores.  Rain will temporarily lower outdoor mold spore count, but counts rise rapidly when the rainy period ends.  The most important indoor molds are  Aspergillus and Penicillium.  Dark, humid and poorly ventilated basements are ideal sites for mold growth.  The next most common sites of mold growth are the bathroom and the kitchen.  Outdoor [employer] Use air conditioning and keep windows closed Avoid exposure to decaying vegetation. Avoid leaf raking. Avoid grain handling. Consider wearing a face mask if working in moldy areas.  Indoor Mold Control Maintain humidity below 50%. Clean washable surfaces with 5% bleach solution. Remove sources e.g. Contaminated carpets.

## 2021-09-13 ENCOUNTER — Other Ambulatory Visit: Payer: Self-pay | Admitting: Pain Medicine

## 2021-09-13 DIAGNOSIS — M5136 Other intervertebral disc degeneration, lumbar region: Secondary | ICD-10-CM

## 2021-09-13 DIAGNOSIS — M5416 Radiculopathy, lumbar region: Secondary | ICD-10-CM

## 2021-09-13 DIAGNOSIS — M47896 Other spondylosis, lumbar region: Secondary | ICD-10-CM

## 2021-10-05 ENCOUNTER — Ambulatory Visit
Admission: RE | Admit: 2021-10-05 | Discharge: 2021-10-05 | Disposition: A | Discharge status: Home / Self Care | Payer: BC Managed Care – PPO | Source: Ambulatory Visit | Attending: Physician Assistant | Admitting: Physician Assistant

## 2021-10-05 ENCOUNTER — Other Ambulatory Visit: Payer: Self-pay

## 2021-10-05 VITALS — BP 137/87 | HR 82 | Temp 98.9°F | Resp 18

## 2021-10-05 DIAGNOSIS — B349 Viral infection, unspecified: Secondary | ICD-10-CM | POA: Diagnosis not present

## 2021-10-05 LAB — POCT INFLUENZA A/B
Influenza A, POC: NEGATIVE
Influenza B, POC: NEGATIVE

## 2021-10-05 MED ORDER — ONDANSETRON 4 MG PO TBDP: 4.0000 mg | ORAL_TABLET | Freq: Three times a day (TID) | ORAL | 0 refills | Status: AC | PRN

## 2021-10-05 NOTE — ED Provider Notes (Signed)
[facility]    CSN: [id number] Arrival date & time: 10/05/21  1343      History   Chief Complaint Chief Complaint  Patient presents with   Appointment    1400   Fever    HPI Kristin Hahn is a 56 y.o. female.   Patient here today for evaluation of headache, body aches, fever and nausea that she has had the last 3 days.  She reports at times she will have some abdominal cramping but no other abdominal pain.  She denies any diarrhea.  She has been taking Dramamine which has helped with nausea.  She has also been using Tylenol which does seem to help somewhat.  She denies any cough or congestion.  The history is provided by the patient.   Past Medical History:  Diagnosis Date   Asthma    Facet joint disease    Gout    Hypertension    Kidney stone    Renal disorder     Patient Active Problem List   Diagnosis Date Noted   Abdominal wall mass of suprapubic region 03/17/2020   Lower abdominal pain 03/17/2020   Menopausal symptoms 03/17/2020   Depression 08/11/2016   Perimenopausal vasomotor symptoms 08/01/2014   Kidney stones 05/09/2014   HTN (hypertension), malignant 05/09/2014   Gout 05/09/2014   Morbid obesity (HCC) 05/09/2014    Past Surgical History:  Procedure Laterality Date   ABDOMINAL HYSTERECTOMY     ADENOIDECTOMY     keloid removal     kidney stone removal     oophrectomy     right   TONSILLECTOMY     TUBAL LIGATION      OB History     Gravida  2   Para  2   Term  2   Preterm      AB      Living  2      SAB      IAB      Ectopic      Multiple      Live Births  2            Home Medications    Prior to Admission medications   Medication Sig Start Date End Date Taking? Authorizing Provider  ondansetron (ZOFRAN-ODT) 4 MG disintegrating tablet Take 1 tablet (4 mg total) by mouth every 8 (eight) hours as needed. 10/05/21  Yes Tomi Bamberger, PA-C  albuterol (PROVENTIL HFA;VENTOLIN HFA) 108 (90 Base)  MCG/ACT inhaler Inhale 2 puffs into the lungs every 4 (four) hours as needed. Wheezing and shortness of breath 07/22/16   Marcelyn Bruins, MD  albuterol (PROVENTIL) (2.5 MG/3ML) 0.083% nebulizer solution Take 3 mLs (2.5 mg total) by nebulization every 4 (four) hours as needed for wheezing or shortness of breath. 01/20/20   Marcelyn Bruins, MD  allopurinol (ZYLOPRIM) 300 MG tablet Take 600 mg by mouth daily. Patient not taking: Reported on 09/15/2020 01/03/19   [provider]  amLODipine (NORVASC) 5 MG tablet Take 5 mg by mouth daily.    [provider]  buPROPion (WELLBUTRIN XL) 300 MG 24 hr tablet Take 300 mg by mouth daily. 03/24/16   [provider]  cloNIDine (CATAPRES) 0.1 MG tablet Take 0.1 mg by mouth daily as needed (sleep).  04/04/12 02/21/28  Earley Favor, NP  cloNIDine (CATAPRES-TTS-1) 0.1 mg/24hr patch Place 1 patch (0.1 mg total) onto the skin once a week. 03/17/20   Johnny Bridge, MD  diclofenac (  VOLTAREN) 75 MG EC tablet TK 1 T PO BID WF OR MILK AND WF PRN 10/29/18   [provider]  diclofenac sodium (VOLTAREN) 1 % GEL APP 2 -4 GRAMS AA QID PRN 01/21/19   [provider]  fluticasone (FLONASE) 50 MCG/ACT nasal spray Place 2 sprays into both nostrils daily. Patient not taking: Reported on 09/15/2020 01/03/20   Marcelyn Bruins, MD  fluticasone furoate-vilanterol (BREO ELLIPTA) 200-25 MCG/INH AEPB Inhale 1 puff into the lungs daily. 01/03/20   Marcelyn Bruins, MD  Fluticasone-Umeclidin-Vilant (TRELEGY ELLIPTA) 200-62.5-25 MCG/INH AEPB Inhale 1 puff into the lungs daily. Patient not taking: Reported on 09/15/2020 05/14/20   Marcelyn Bruins, MD  levalbuterol [facility] HFA) 45 MCG/ACT inhaler Inhale 2 puffs into the lungs every 6 (six) hours as needed for wheezing. 01/03/20   Marcelyn Bruins, MD  LINZESS 145 MCG CAPS capsule TK ONE C PO  D PRN 01/02/19   [provider]  montelukast  (SINGULAIR) 10 MG tablet Take 1 tablet (10 mg total) by mouth at bedtime. 01/03/20   Marcelyn Bruins, MD  olmesartan (BENICAR) 40 MG tablet 1 tablet 10/09/15   [provider]  oxyCODONE-acetaminophen (PERCOCET) 10-325 MG tablet TK 1 T PO  Q 6 H AND AT BEDTIME AS NEEDED 01/30/19   [provider]  Tiotropium Bromide Monohydrate (SPIRIVA RESPIMAT) 1.25 MCG/ACT AERS Inhale 2 puffs into the lungs daily. 09/18/20   Nehemiah Settle, FNP  tiZANidine (ZANAFLEX) 4 MG tablet TK 1 T PO TID PRN 01/21/19   [provider]  triamcinolone ointment (KENALOG) 0.5 % Apply topically 2 (two) times daily. 12/19/18   Brock Bad, MD  TRINTELLIX 20 MG TABS tablet TK 1 T PO D 12/24/18   [provider]  Vitamin D, Ergocalciferol, (DRISDOL) 1.25 MG (50000 UT) CAPS capsule TK 1 C PO WEEKLY Patient not taking: Reported on 09/15/2020 02/07/19   [provider]  norethindrone (MICRONOR,CAMILA,ERRIN) 0.35 MG tablet Take 1 tablet (0.35 mg total) by mouth daily. Patient not taking: Reported on 07/30/2014 04/23/14 02/07/15  Antionette Char, MD    Family History Family History  Problem Relation Age of Onset   Diabetes Mother    Hypertension Mother    Diabetes Father    Hypertension Father    Eczema Daughter    Asthma Daughter    Allergic rhinitis Neg Hx    Angioedema Neg Hx    Immunodeficiency Neg Hx    Urticaria Neg Hx     Social History Social History   Tobacco Use   Smoking status: Never   Smokeless tobacco: Never  Vaping Use   Vaping Use: Never used  Substance Use Topics   Alcohol use: No    Alcohol/week: 0.0 standard drinks   Drug use: No     Allergies   Erythromycin base   Review of Systems Review of Systems  Constitutional:  Positive for fever.  HENT:  Positive for congestion. Negative for ear pain and sore throat.   Eyes:  Negative for discharge and redness.  Respiratory:  Negative for cough, shortness of breath and wheezing.    Gastrointestinal:  Positive for nausea. Negative for abdominal pain, diarrhea and vomiting.    Physical Exam Triage Vital Signs ED Triage Vitals  Enc Vitals Group     BP      Pulse      Resp      Temp      Temp src      SpO2  Weight      Height      Head Circumference      Peak Flow      Pain Score      Pain Loc      Pain Edu?      Excl. in GC?    No data found.  Updated Vital Signs BP 137/87 (BP Location: Left Arm)    Pulse 82    Temp 98.9 F (37.2 C) (Oral)    Resp 18    SpO2 97%  \ Physical Exam Vitals and nursing note reviewed.  Constitutional:      General: She is not in acute distress.    Appearance: Normal appearance. She is not ill-appearing.  HENT:     Head: Normocephalic and atraumatic.     Nose: No congestion or rhinorrhea.     Mouth/Throat:     Mouth: Mucous membranes are moist.     Pharynx: No oropharyngeal exudate or posterior oropharyngeal erythema.  Eyes:     Conjunctiva/sclera: Conjunctivae normal.  Cardiovascular:     Rate and Rhythm: Normal rate and regular rhythm.     Heart sounds: Normal heart sounds. No murmur heard. Pulmonary:     Effort: Pulmonary effort is normal. No respiratory distress.     Breath sounds: Normal breath sounds. No wheezing, rhonchi or rales.  Skin:    General: Skin is warm and dry.  Neurological:     Mental Status: She is alert.  Psychiatric:        Mood and Affect: Mood normal.        Thought Content: Thought content normal.     UC Treatments / Results  Labs (all labs ordered are listed, but only abnormal results are displayed) Labs Reviewed  POCT INFLUENZA A/B    EKG   Radiology No results found.  Procedures Procedures (including critical care time)  Medications Ordered in UC Medications - No data to display  Initial Impression / Assessment and Plan / UC Course  I have reviewed the triage vital signs and the nursing notes.  Pertinent labs & imaging results that were available during my  care of the patient were reviewed by me and considered in my medical decision making (see chart for details).    Suspect likely viral etiology of symptoms.  Zofran prescribed for nausea.  Recommended  follow-up if symptoms fail to improve or worsen over the next few days.  Final Clinical Impressions(s) / UC Diagnoses   Final diagnoses:  Viral syndrome   Discharge Instructions   None    ED Prescriptions     Medication Sig Dispense Auth. Provider   ondansetron (ZOFRAN-ODT) 4 MG disintegrating tablet Take 1 tablet (4 mg total) by mouth every 8 (eight) hours as needed. 20 tablet Tomi Bamberger, PA-C      PDMP not reviewed this encounter.   Tomi Bamberger, PA-C 10/05/21 1551

## 2021-10-05 NOTE — ED Triage Notes (Signed)
Pt here for HA, body aches and fever with nausea x 3 days; per pt fever started today; took tylenol PTA

## 2021-10-07 ENCOUNTER — Other Ambulatory Visit: Payer: Self-pay | Admitting: Internal Medicine

## 2021-10-07 DIAGNOSIS — J4520 Mild intermittent asthma, uncomplicated: Secondary | ICD-10-CM | POA: Diagnosis not present

## 2021-10-07 DIAGNOSIS — I10 Essential (primary) hypertension: Secondary | ICD-10-CM | POA: Diagnosis not present

## 2021-10-07 DIAGNOSIS — Z0000 Encounter for general adult medical examination without abnormal findings: Secondary | ICD-10-CM | POA: Diagnosis not present

## 2021-10-07 DIAGNOSIS — M609 Myositis, unspecified: Secondary | ICD-10-CM | POA: Diagnosis not present

## 2021-10-07 DIAGNOSIS — E559 Vitamin D deficiency, unspecified: Secondary | ICD-10-CM | POA: Diagnosis not present

## 2021-10-07 DIAGNOSIS — M25512 Pain in left shoulder: Secondary | ICD-10-CM | POA: Diagnosis not present

## 2021-10-07 DIAGNOSIS — R7303 Prediabetes: Secondary | ICD-10-CM | POA: Diagnosis not present

## 2021-10-07 DIAGNOSIS — M1A9XX0 Chronic gout, unspecified, without tophus (tophi): Secondary | ICD-10-CM | POA: Diagnosis not present

## 2021-10-07 DIAGNOSIS — M25519 Pain in unspecified shoulder: Secondary | ICD-10-CM | POA: Diagnosis not present

## 2021-10-08 LAB — URIC ACID: Uric Acid, Serum: 7.9 mg/dL — ABNORMAL HIGH (ref 2.5–7.0)

## 2021-10-08 LAB — LIPID PANEL
Cholesterol: 132 mg/dL (ref <200)
HDL: 34 mg/dL — ABNORMAL LOW (ref >=50)
LDL Cholesterol (Calc): 79 mg/dL (calc)
Non-HDL Cholesterol (Calc): 98 mg/dL (calc) (ref <130)
Total CHOL/HDL Ratio: 3.9 (calc) (ref <5.0)
Triglycerides: 105 mg/dL (ref <150)

## 2021-10-08 LAB — COMPLETE METABOLIC PANEL WITH GFR
AG Ratio: 1.4 (calc) (ref 1.0–2.5)
ALT: 20 U/L (ref 6–29)
AST: 27 U/L (ref 10–35)
Albumin: 4.3 g/dL (ref 3.6–5.1)
Alkaline phosphatase (APISO): 67 U/L (ref 37–153)
BUN/Creatinine Ratio: 15 (calc) (ref 6–22)
BUN: 18 mg/dL (ref 7–25)
CO2: 26 mmol/L (ref 20–32)
Calcium: 9.9 mg/dL (ref 8.6–10.4)
Chloride: 104 mmol/L (ref 98–110)
Creat: 1.2 mg/dL — ABNORMAL HIGH (ref 0.50–1.03)
Globulin: 3 g/dL (calc) (ref 1.9–3.7)
Glucose, Bld: 81 mg/dL (ref 65–99)
Potassium: 3.1 mmol/L — ABNORMAL LOW (ref 3.5–5.3)
Sodium: 141 mmol/L (ref 135–146)
Total Bilirubin: 0.3 mg/dL (ref 0.2–1.2)
Total Protein: 7.3 g/dL (ref 6.1–8.1)
eGFR: 53 mL/min/{1.73_m2} — ABNORMAL LOW (ref >=60)

## 2021-10-08 LAB — CBC
HCT: 37.1 % (ref 35.0–45.0)
Hemoglobin: 11.7 g/dL (ref 11.7–15.5)
MCH: 26.9 pg — ABNORMAL LOW (ref 27.0–33.0)
MCHC: 31.5 g/dL — ABNORMAL LOW (ref 32.0–36.0)
MCV: 85.3 fL (ref 80.0–100.0)
MPV: 12.7 fL — ABNORMAL HIGH (ref 7.5–12.5)
Platelets: 227 10*3/uL (ref 140–400)
RBC: 4.35 10*6/uL (ref 3.80–5.10)
RDW: 14.4 % (ref 11.0–15.0)
WBC: 6.9 10*3/uL (ref 3.8–10.8)

## 2021-10-08 LAB — TSH: TSH: 1.02 mIU/L

## 2021-10-08 LAB — CK: Total CK: 767 U/L — ABNORMAL HIGH (ref 29–143)

## 2021-10-08 LAB — VITAMIN D 25 HYDROXY (VIT D DEFICIENCY, FRACTURES): Vit D, 25-Hydroxy: 55 ng/mL (ref 30–100)

## 2021-10-08 LAB — MAGNESIUM: Magnesium: 1.8 mg/dL (ref 1.5–2.5)

## 2021-10-15 DIAGNOSIS — J4520 Mild intermittent asthma, uncomplicated: Secondary | ICD-10-CM | POA: Diagnosis not present

## 2021-10-15 DIAGNOSIS — M6289 Other specified disorders of muscle: Secondary | ICD-10-CM | POA: Diagnosis not present

## 2021-10-15 DIAGNOSIS — R7303 Prediabetes: Secondary | ICD-10-CM | POA: Diagnosis not present

## 2021-10-15 DIAGNOSIS — I10 Essential (primary) hypertension: Secondary | ICD-10-CM | POA: Diagnosis not present

## 2021-11-06 DIAGNOSIS — Z79899 Other long term (current) drug therapy: Secondary | ICD-10-CM | POA: Diagnosis not present

## 2021-11-06 DIAGNOSIS — Z6841 Body Mass Index (BMI) 40.0 and over, adult: Secondary | ICD-10-CM | POA: Diagnosis not present

## 2021-11-06 DIAGNOSIS — E538 Deficiency of other specified B group vitamins: Secondary | ICD-10-CM | POA: Diagnosis not present

## 2021-11-06 DIAGNOSIS — R7303 Prediabetes: Secondary | ICD-10-CM | POA: Diagnosis not present

## 2021-11-06 DIAGNOSIS — E559 Vitamin D deficiency, unspecified: Secondary | ICD-10-CM | POA: Diagnosis not present

## 2021-11-06 DIAGNOSIS — F5081 Binge eating disorder: Secondary | ICD-10-CM | POA: Diagnosis not present

## 2021-11-08 DIAGNOSIS — Z79891 Long term (current) use of opiate analgesic: Secondary | ICD-10-CM | POA: Diagnosis not present

## 2021-11-08 DIAGNOSIS — M5136 Other intervertebral disc degeneration, lumbar region: Secondary | ICD-10-CM | POA: Diagnosis not present

## 2021-11-08 DIAGNOSIS — G894 Chronic pain syndrome: Secondary | ICD-10-CM | POA: Diagnosis not present

## 2021-11-08 DIAGNOSIS — M47896 Other spondylosis, lumbar region: Secondary | ICD-10-CM | POA: Diagnosis not present

## 2021-11-08 DIAGNOSIS — M5416 Radiculopathy, lumbar region: Secondary | ICD-10-CM | POA: Diagnosis not present

## 2021-11-15 ENCOUNTER — Other Ambulatory Visit: Payer: Self-pay | Admitting: Pain Medicine

## 2021-11-15 ENCOUNTER — Other Ambulatory Visit: Payer: BC Managed Care – PPO

## 2021-11-15 DIAGNOSIS — M5416 Radiculopathy, lumbar region: Secondary | ICD-10-CM

## 2021-11-15 DIAGNOSIS — M47896 Other spondylosis, lumbar region: Secondary | ICD-10-CM

## 2021-11-15 DIAGNOSIS — M5136 Other intervertebral disc degeneration, lumbar region: Secondary | ICD-10-CM

## 2021-11-17 DIAGNOSIS — R7303 Prediabetes: Secondary | ICD-10-CM | POA: Diagnosis not present

## 2021-11-17 DIAGNOSIS — I10 Essential (primary) hypertension: Secondary | ICD-10-CM | POA: Diagnosis not present

## 2021-11-17 DIAGNOSIS — J4520 Mild intermittent asthma, uncomplicated: Secondary | ICD-10-CM | POA: Diagnosis not present

## 2021-11-17 DIAGNOSIS — R600 Localized edema: Secondary | ICD-10-CM | POA: Diagnosis not present

## 2021-11-20 DIAGNOSIS — Z1231 Encounter for screening mammogram for malignant neoplasm of breast: Secondary | ICD-10-CM | POA: Diagnosis not present

## 2021-12-02 DIAGNOSIS — I872 Venous insufficiency (chronic) (peripheral): Secondary | ICD-10-CM | POA: Diagnosis not present

## 2021-12-02 DIAGNOSIS — I10 Essential (primary) hypertension: Secondary | ICD-10-CM | POA: Diagnosis not present

## 2021-12-02 DIAGNOSIS — B351 Tinea unguium: Secondary | ICD-10-CM | POA: Diagnosis not present

## 2021-12-02 DIAGNOSIS — J4520 Mild intermittent asthma, uncomplicated: Secondary | ICD-10-CM | POA: Diagnosis not present

## 2021-12-09 DIAGNOSIS — Z79891 Long term (current) use of opiate analgesic: Secondary | ICD-10-CM | POA: Diagnosis not present

## 2021-12-09 DIAGNOSIS — M791 Myalgia, unspecified site: Secondary | ICD-10-CM | POA: Diagnosis not present

## 2021-12-09 DIAGNOSIS — G894 Chronic pain syndrome: Secondary | ICD-10-CM | POA: Diagnosis not present

## 2021-12-09 DIAGNOSIS — Z5181 Encounter for therapeutic drug level monitoring: Secondary | ICD-10-CM | POA: Diagnosis not present

## 2021-12-10 ENCOUNTER — Telehealth: Payer: Self-pay | Admitting: Family Medicine

## 2021-12-10 NOTE — Telephone Encounter (Signed)
Copied from CRM [id number]. Topic: General - Other ?>> Dec 10, 2021  2:56 PM Wyonia Hough E wrote: ?Reason for CRM: Pt called to speak with Dr. Andrey Campanile / she asked if she can please give her a call / they attend church together / please advise ?

## 2021-12-11 DIAGNOSIS — R531 Weakness: Secondary | ICD-10-CM | POA: Diagnosis not present

## 2021-12-11 DIAGNOSIS — R079 Chest pain, unspecified: Secondary | ICD-10-CM | POA: Diagnosis not present

## 2021-12-11 DIAGNOSIS — M79604 Pain in right leg: Secondary | ICD-10-CM | POA: Diagnosis not present

## 2021-12-11 DIAGNOSIS — R0602 Shortness of breath: Secondary | ICD-10-CM | POA: Diagnosis not present

## 2021-12-11 DIAGNOSIS — J811 Chronic pulmonary edema: Secondary | ICD-10-CM | POA: Diagnosis not present

## 2021-12-11 DIAGNOSIS — M79605 Pain in left leg: Secondary | ICD-10-CM | POA: Diagnosis not present

## 2021-12-12 DIAGNOSIS — M79605 Pain in left leg: Secondary | ICD-10-CM | POA: Diagnosis not present

## 2021-12-12 DIAGNOSIS — M79604 Pain in right leg: Secondary | ICD-10-CM | POA: Diagnosis not present

## 2021-12-13 ENCOUNTER — Other Ambulatory Visit: Payer: BC Managed Care – PPO

## 2021-12-13 ENCOUNTER — Ambulatory Visit
Admission: RE | Admit: 2021-12-13 | Discharge: 2021-12-13 | Disposition: A | Discharge status: Home / Self Care | Payer: BC Managed Care – PPO | Source: Ambulatory Visit | Attending: Pain Medicine | Admitting: Pain Medicine

## 2021-12-13 DIAGNOSIS — M5136 Other intervertebral disc degeneration, lumbar region: Secondary | ICD-10-CM

## 2021-12-13 DIAGNOSIS — M48061 Spinal stenosis, lumbar region without neurogenic claudication: Secondary | ICD-10-CM | POA: Diagnosis not present

## 2021-12-13 DIAGNOSIS — G894 Chronic pain syndrome: Secondary | ICD-10-CM | POA: Diagnosis not present

## 2021-12-13 DIAGNOSIS — M47896 Other spondylosis, lumbar region: Secondary | ICD-10-CM

## 2021-12-13 DIAGNOSIS — M5416 Radiculopathy, lumbar region: Secondary | ICD-10-CM | POA: Diagnosis not present

## 2021-12-13 DIAGNOSIS — Z79891 Long term (current) use of opiate analgesic: Secondary | ICD-10-CM | POA: Diagnosis not present

## 2021-12-13 DIAGNOSIS — M5127 Other intervertebral disc displacement, lumbosacral region: Secondary | ICD-10-CM | POA: Diagnosis not present

## 2021-12-13 DIAGNOSIS — M791 Myalgia, unspecified site: Secondary | ICD-10-CM | POA: Diagnosis not present

## 2021-12-13 DIAGNOSIS — M47816 Spondylosis without myelopathy or radiculopathy, lumbar region: Secondary | ICD-10-CM | POA: Diagnosis not present

## 2021-12-16 DIAGNOSIS — M545 Low back pain, unspecified: Secondary | ICD-10-CM | POA: Diagnosis not present

## 2022-01-08 DIAGNOSIS — Z79899 Other long term (current) drug therapy: Secondary | ICD-10-CM | POA: Diagnosis not present

## 2022-01-08 DIAGNOSIS — Z6841 Body Mass Index (BMI) 40.0 and over, adult: Secondary | ICD-10-CM | POA: Diagnosis not present

## 2022-01-08 DIAGNOSIS — E039 Hypothyroidism, unspecified: Secondary | ICD-10-CM | POA: Diagnosis not present

## 2022-01-08 DIAGNOSIS — E7800 Pure hypercholesterolemia, unspecified: Secondary | ICD-10-CM | POA: Diagnosis not present

## 2022-01-08 DIAGNOSIS — R7303 Prediabetes: Secondary | ICD-10-CM | POA: Diagnosis not present

## 2022-01-08 DIAGNOSIS — E538 Deficiency of other specified B group vitamins: Secondary | ICD-10-CM | POA: Diagnosis not present

## 2022-01-08 DIAGNOSIS — E559 Vitamin D deficiency, unspecified: Secondary | ICD-10-CM | POA: Diagnosis not present

## 2022-01-27 DIAGNOSIS — M5417 Radiculopathy, lumbosacral region: Secondary | ICD-10-CM | POA: Diagnosis not present

## 2022-01-27 DIAGNOSIS — G603 Idiopathic progressive neuropathy: Secondary | ICD-10-CM | POA: Diagnosis not present

## 2022-02-23 DIAGNOSIS — M1A9XX0 Chronic gout, unspecified, without tophus (tophi): Secondary | ICD-10-CM | POA: Diagnosis not present

## 2022-02-23 DIAGNOSIS — J4520 Mild intermittent asthma, uncomplicated: Secondary | ICD-10-CM | POA: Diagnosis not present

## 2022-02-23 DIAGNOSIS — I10 Essential (primary) hypertension: Secondary | ICD-10-CM | POA: Diagnosis not present

## 2022-02-23 DIAGNOSIS — R7303 Prediabetes: Secondary | ICD-10-CM | POA: Diagnosis not present

## 2022-02-28 DIAGNOSIS — G894 Chronic pain syndrome: Secondary | ICD-10-CM | POA: Diagnosis not present

## 2022-02-28 DIAGNOSIS — M47896 Other spondylosis, lumbar region: Secondary | ICD-10-CM | POA: Diagnosis not present

## 2022-02-28 DIAGNOSIS — Z79891 Long term (current) use of opiate analgesic: Secondary | ICD-10-CM | POA: Diagnosis not present

## 2022-02-28 DIAGNOSIS — M5416 Radiculopathy, lumbar region: Secondary | ICD-10-CM | POA: Diagnosis not present

## 2022-02-28 DIAGNOSIS — M5136 Other intervertebral disc degeneration, lumbar region: Secondary | ICD-10-CM | POA: Diagnosis not present

## 2022-05-04 DIAGNOSIS — M4187 Other forms of scoliosis, lumbosacral region: Secondary | ICD-10-CM | POA: Diagnosis not present

## 2022-05-04 DIAGNOSIS — M419 Scoliosis, unspecified: Secondary | ICD-10-CM | POA: Diagnosis not present

## 2022-05-04 DIAGNOSIS — M545 Low back pain, unspecified: Secondary | ICD-10-CM | POA: Diagnosis not present

## 2022-05-04 DIAGNOSIS — M5416 Radiculopathy, lumbar region: Secondary | ICD-10-CM | POA: Diagnosis not present

## 2022-05-11 DIAGNOSIS — G894 Chronic pain syndrome: Secondary | ICD-10-CM | POA: Diagnosis not present

## 2022-05-11 DIAGNOSIS — M47896 Other spondylosis, lumbar region: Secondary | ICD-10-CM | POA: Diagnosis not present

## 2022-05-11 DIAGNOSIS — M5136 Other intervertebral disc degeneration, lumbar region: Secondary | ICD-10-CM | POA: Diagnosis not present

## 2022-05-11 DIAGNOSIS — Z79891 Long term (current) use of opiate analgesic: Secondary | ICD-10-CM | POA: Diagnosis not present

## 2022-05-11 DIAGNOSIS — M5416 Radiculopathy, lumbar region: Secondary | ICD-10-CM | POA: Diagnosis not present

## 2022-05-31 DIAGNOSIS — Z23 Encounter for immunization: Secondary | ICD-10-CM | POA: Diagnosis not present

## 2022-05-31 DIAGNOSIS — I10 Essential (primary) hypertension: Secondary | ICD-10-CM | POA: Diagnosis not present

## 2022-05-31 DIAGNOSIS — R7303 Prediabetes: Secondary | ICD-10-CM | POA: Diagnosis not present

## 2022-05-31 DIAGNOSIS — I872 Venous insufficiency (chronic) (peripheral): Secondary | ICD-10-CM | POA: Diagnosis not present

## 2022-05-31 DIAGNOSIS — J4520 Mild intermittent asthma, uncomplicated: Secondary | ICD-10-CM | POA: Diagnosis not present

## 2022-07-02 DIAGNOSIS — J209 Acute bronchitis, unspecified: Secondary | ICD-10-CM | POA: Diagnosis not present

## 2022-07-02 DIAGNOSIS — R030 Elevated blood-pressure reading, without diagnosis of hypertension: Secondary | ICD-10-CM | POA: Diagnosis not present

## 2022-07-02 DIAGNOSIS — R059 Cough, unspecified: Secondary | ICD-10-CM | POA: Diagnosis not present

## 2022-07-02 DIAGNOSIS — J45901 Unspecified asthma with (acute) exacerbation: Secondary | ICD-10-CM | POA: Diagnosis not present

## 2022-07-02 DIAGNOSIS — R062 Wheezing: Secondary | ICD-10-CM | POA: Diagnosis not present

## 2022-07-02 DIAGNOSIS — Z6841 Body Mass Index (BMI) 40.0 and over, adult: Secondary | ICD-10-CM | POA: Diagnosis not present

## 2022-07-08 DIAGNOSIS — Z79891 Long term (current) use of opiate analgesic: Secondary | ICD-10-CM | POA: Diagnosis not present

## 2022-07-08 DIAGNOSIS — M791 Myalgia, unspecified site: Secondary | ICD-10-CM | POA: Diagnosis not present

## 2022-07-08 DIAGNOSIS — M5136 Other intervertebral disc degeneration, lumbar region: Secondary | ICD-10-CM | POA: Diagnosis not present

## 2022-07-08 DIAGNOSIS — M5416 Radiculopathy, lumbar region: Secondary | ICD-10-CM | POA: Diagnosis not present

## 2022-07-08 DIAGNOSIS — M47896 Other spondylosis, lumbar region: Secondary | ICD-10-CM | POA: Diagnosis not present

## 2022-07-08 DIAGNOSIS — G894 Chronic pain syndrome: Secondary | ICD-10-CM | POA: Diagnosis not present

## 2022-09-03 DIAGNOSIS — R030 Elevated blood-pressure reading, without diagnosis of hypertension: Secondary | ICD-10-CM | POA: Diagnosis not present

## 2022-09-03 DIAGNOSIS — R062 Wheezing: Secondary | ICD-10-CM | POA: Diagnosis not present

## 2022-09-03 DIAGNOSIS — M5416 Radiculopathy, lumbar region: Secondary | ICD-10-CM | POA: Diagnosis not present

## 2022-09-03 DIAGNOSIS — M79604 Pain in right leg: Secondary | ICD-10-CM | POA: Diagnosis not present

## 2022-09-03 DIAGNOSIS — Z6841 Body Mass Index (BMI) 40.0 and over, adult: Secondary | ICD-10-CM | POA: Diagnosis not present

## 2022-09-03 DIAGNOSIS — M79605 Pain in left leg: Secondary | ICD-10-CM | POA: Diagnosis not present

## 2022-09-03 DIAGNOSIS — M791 Myalgia, unspecified site: Secondary | ICD-10-CM | POA: Diagnosis not present

## 2022-09-07 DIAGNOSIS — M5136 Other intervertebral disc degeneration, lumbar region: Secondary | ICD-10-CM | POA: Diagnosis not present

## 2022-09-07 DIAGNOSIS — M47896 Other spondylosis, lumbar region: Secondary | ICD-10-CM | POA: Diagnosis not present

## 2022-09-07 DIAGNOSIS — G894 Chronic pain syndrome: Secondary | ICD-10-CM | POA: Diagnosis not present

## 2022-09-07 DIAGNOSIS — Z79891 Long term (current) use of opiate analgesic: Secondary | ICD-10-CM | POA: Diagnosis not present

## 2022-09-07 DIAGNOSIS — M5416 Radiculopathy, lumbar region: Secondary | ICD-10-CM | POA: Diagnosis not present

## 2022-09-15 DIAGNOSIS — M1A9XX0 Chronic gout, unspecified, without tophus (tophi): Secondary | ICD-10-CM | POA: Diagnosis not present

## 2022-09-15 DIAGNOSIS — E559 Vitamin D deficiency, unspecified: Secondary | ICD-10-CM | POA: Diagnosis not present

## 2022-09-15 DIAGNOSIS — J4520 Mild intermittent asthma, uncomplicated: Secondary | ICD-10-CM | POA: Diagnosis not present

## 2022-09-15 DIAGNOSIS — E1149 Type 2 diabetes mellitus with other diabetic neurological complication: Secondary | ICD-10-CM | POA: Diagnosis not present

## 2022-09-15 DIAGNOSIS — R7303 Prediabetes: Secondary | ICD-10-CM | POA: Diagnosis not present

## 2022-09-15 DIAGNOSIS — I10 Essential (primary) hypertension: Secondary | ICD-10-CM | POA: Diagnosis not present

## 2022-09-15 DIAGNOSIS — Z0000 Encounter for general adult medical examination without abnormal findings: Secondary | ICD-10-CM | POA: Diagnosis not present

## 2022-10-07 IMAGING — CT CT L SPINE W/O CM
1 of 9 series · 4 of 14 positions shown, 5 images · non-contrast
Comparison: Lumbar spine radiographs 06/06/2019

CLINICAL DATA: Back pain, leg pain, scoliosis, numbness and
tingling down the legs. Spinal cord stimulator in place



[Series 3: l spine soft · axial · 0.35mm/px · z∈[+697,+867]mm · 4 of 143 slices shown, 5 images]
[im 29/143  soft-tissue]
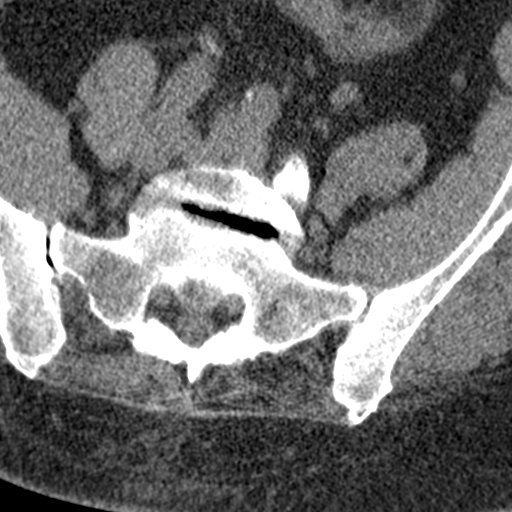
[im 29/143  bone]
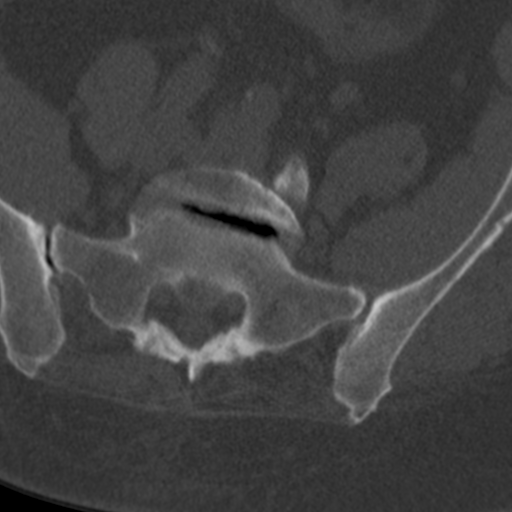
[im 57/143  bone]
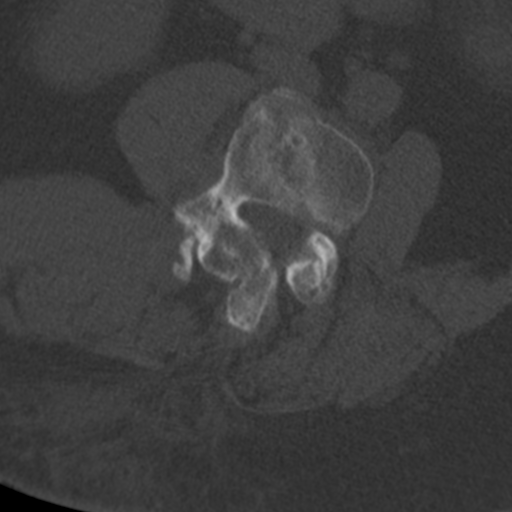
[im 86/143  bone]
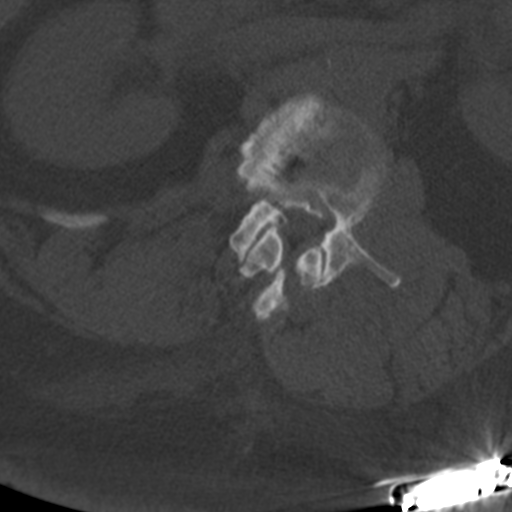
[im 114/143  bone]
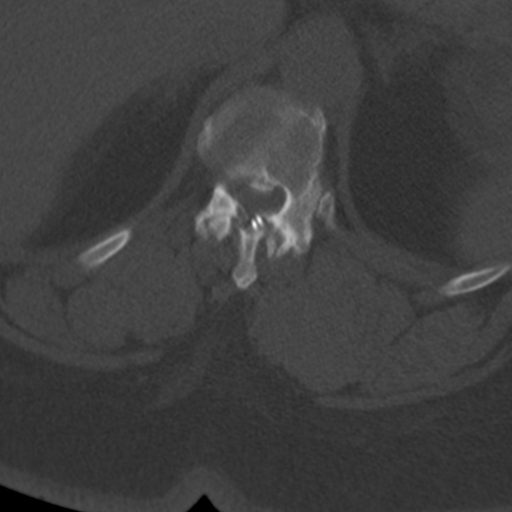

[4 of 14 positions shown; findings below may reference images not displayed]

FINDINGS: Segmentation: Standard; the lowest formed disc space is designated
L5-S1.

Alignment: There is marked levocurvature centered at L2. There is 9
mm left lateral listhesis of L4 on L5. There is no antero or
retrolisthesis. Alignment is grossly similar to the radiographs from
2222.

Vertebrae: Vertebral body heights are preserved. There is no
evidence of acute injury. Is multilevel degenerative endplate change
with prominent endplate osteophytes. There is no suspicious osseous
lesion.

Paraspinal and other soft tissues: A spinal stimulator is noted in
the left lower back subcutaneous fat with leads entering the spinal
canal at the T11-T12 level. The superior aspect of the leads is not
imaged.

Disc levels: There is marked multilevel disc space narrowing with
vacuum disc phenomenon at L1-L2 through L5-S1.

T11-T12: There is degenerative endplate change with a partially
calcified disc bulge and bilateral facet arthropathy resulting in at
least moderate spinal canal stenosis and severe left and moderate
right neural foraminal stenosis.

T12-L1: There is degenerative endplate change with a partially
calcified disc bulge and mild bilateral facet arthropathy resulting
in mild spinal canal stenosis and mild left and no significant right
neural foraminal stenosis.

L1-L2: There is a partially calcified diffuse disc bulge,
degenerative endplate change, and right worse than left facet
arthropathy with ankylosis across the left facet joint resulting in
mild spinal canal stenosis and severe right and mild left neural
foraminal stenosis.

L2-L3: There is a diffuse disc bulge, degenerative endplate change,
and right worse than left facet arthropathy with ankylosis across
the left facet joint resulting in mild-to-moderate spinal canal
stenosis and severe right and mild left neural foraminal stenosis.

L3-L4: There is degenerative endplate change and bilateral facet
arthropathy with ankylosis across the bilateral facets resulting in
mild spinal canal stenosis and severe right and moderate left neural
foraminal stenosis.

L4-L5: There is a disc bulge, degenerative endplate change, and
severe facet arthropathy resulting in severe spinal canal stenosis
and severe bilateral neural foraminal stenosis. There is widening of
the facet joints at this level, right more than left, without
evidence of osseous destruction.

L5-S1: There is a diffuse disc bulge, degenerative endplate change,
and severe bilateral facet arthropathy resulting in severe spinal
canal stenosis and severe left and moderate right neural foraminal
stenosis.

Overall, the above findings are markedly worsened since 2387.
IMPRESSION: 1. Marked levocurvature of the lumbar spine centered at L2 with 9 mm
left lateral listhesis of L4 on L5, similar to the radiographs from
2222 common but overall the degenerative changes described have
markedly worsened since [DATE]. Associated advanced multilevel degenerative endplate change, disc
space narrowing with vacuum disc phenomenon, and facet arthropathy
throughout the lumbar spine with multilevel ankylosis of the facet
joints.
3. Multilevel spinal canal and neural foraminal stenosis as detailed
above including at least moderate spinal canal stenosis at T11-T12,
mild-to-moderate spinal canal stenosis at L2-L3 and severe spinal
canal stenosis at L4-L5 and L5-S1.
4. Severe neural foraminal stenosis on the right at L1-L2 through
L4-L5, and severe left neural foraminal stenosis T11-T12, L4-L5, and
L5-S1.
5. Widening of the facet joints at L4-L5 suggests the presence of
facet joint effusions. No evidence of osseous destruction to suggest
septic arthritis.
6. Consider MRI (if not contraindicated) or CT myelogram for better
evaluation of spinal canal patency and potential nerve root
impingement as indicated.

## 2022-10-13 DIAGNOSIS — I10 Essential (primary) hypertension: Secondary | ICD-10-CM | POA: Diagnosis not present

## 2022-10-13 DIAGNOSIS — E1321 Other specified diabetes mellitus with diabetic nephropathy: Secondary | ICD-10-CM | POA: Diagnosis not present

## 2022-10-26 DIAGNOSIS — M47896 Other spondylosis, lumbar region: Secondary | ICD-10-CM | POA: Diagnosis not present

## 2022-10-26 DIAGNOSIS — M5416 Radiculopathy, lumbar region: Secondary | ICD-10-CM | POA: Diagnosis not present

## 2022-10-26 DIAGNOSIS — Z79891 Long term (current) use of opiate analgesic: Secondary | ICD-10-CM | POA: Diagnosis not present

## 2022-10-26 DIAGNOSIS — G894 Chronic pain syndrome: Secondary | ICD-10-CM | POA: Diagnosis not present

## 2022-10-26 DIAGNOSIS — M5136 Other intervertebral disc degeneration, lumbar region: Secondary | ICD-10-CM | POA: Diagnosis not present

## 2022-11-14 DIAGNOSIS — M791 Myalgia, unspecified site: Secondary | ICD-10-CM | POA: Diagnosis not present

## 2022-11-15 ENCOUNTER — Encounter: Payer: Self-pay | Admitting: Advanced Practice Midwife

## 2022-11-15 ENCOUNTER — Ambulatory Visit: Payer: Federal, State, Local not specified - PPO | Admitting: Advanced Practice Midwife

## 2022-11-15 VITALS — BP 146/97 | HR 81 | Ht 62.0 in | Wt 267.8 lb

## 2022-11-15 DIAGNOSIS — Z01419 Encounter for gynecological examination (general) (routine) without abnormal findings: Secondary | ICD-10-CM

## 2022-11-15 DIAGNOSIS — R4586 Emotional lability: Secondary | ICD-10-CM

## 2022-11-15 DIAGNOSIS — M545 Low back pain, unspecified: Secondary | ICD-10-CM | POA: Diagnosis not present

## 2022-11-15 DIAGNOSIS — K458 Other specified abdominal hernia without obstruction or gangrene: Secondary | ICD-10-CM | POA: Diagnosis not present

## 2022-11-15 DIAGNOSIS — N951 Menopausal and female climacteric states: Secondary | ICD-10-CM

## 2022-11-15 DIAGNOSIS — N644 Mastodynia: Secondary | ICD-10-CM

## 2022-11-15 DIAGNOSIS — F322 Major depressive disorder, single episode, severe without psychotic features: Secondary | ICD-10-CM

## 2022-11-15 DIAGNOSIS — Z1339 Encounter for screening examination for other mental health and behavioral disorders: Secondary | ICD-10-CM | POA: Diagnosis not present

## 2022-11-15 MED ORDER — BUPROPION HCL ER (XL) 300 MG PO TB24: 300.0000 mg | ORAL_TABLET | Freq: Every day | ORAL | 11 refills | Status: DC

## 2022-11-15 MED ORDER — BUPROPION HCL ER (SR) 150 MG PO TB12: 150.0000 mg | ORAL_TABLET | Freq: Two times a day (BID) | ORAL | 0 refills | Status: DC

## 2022-11-15 NOTE — Progress Notes (Deleted)
ANNUAL EXAM Patient name: Kristin Hahn MRN [id number]  Date of birth: 09-Jun-1966 Chief Complaint:   No chief complaint on file.  History of Present Illness:   Kristin Hahn is a 57 y.o. G70P2002 {race:25618} female being seen today for a routine annual exam.  Current complaints: ***  No LMP recorded. Patient has had a hysterectomy.   The pregnancy intention screening data noted above was reviewed. Potential methods of contraception were discussed. The patient elected to proceed with No data recorded.   Last pap ***. Results were: {Pap findings:25134}. H/O abnormal pap: {yes/yes***/no:23866} Last mammogram: ***. Results were: {normal, abnormal, n/a:23837}. Family h/o breast cancer: {yes***/no:23838} Last colonoscopy: ***. Results were: {normal, abnormal, n/a:23837}. Family h/o colorectal cancer: {yes***/no:23838}     08/19/2016   12:48 PM 07/30/2014    4:26 PM 04/23/2014   11:31 AM  Depression screen PHQ 2/9  Decreased Interest 3 1 2   Down, Depressed, Hopeless 3 1 3   PHQ - 2 Score 6 2 5   Altered sleeping 3 3 3   Tired, decreased energy 3 3 2   Change in appetite 3 0 2  Feeling bad or failure about yourself  3 0 0  Trouble concentrating 3 1 0  Moving slowly or fidgety/restless 3 0 0  Suicidal thoughts 0 0 0  PHQ-9 Score 24 9 12         08/19/2016   12:48 PM  GAD 7 : Generalized Anxiety Score  Nervous, Anxious, on Edge 3  Control/stop worrying 3  Worry too much - different things 3  Trouble relaxing 3  Restless 3  Easily annoyed or irritable 3  Afraid - awful might happen 3  Total GAD 7 Score 21     Review of Systems:   Pertinent items are noted in HPI Denies any headaches, blurred vision, fatigue, shortness of breath, chest pain, abdominal pain, abnormal vaginal discharge/itching/odor/irritation, problems with periods, bowel movements, urination, or intercourse unless otherwise stated above. Pertinent History Reviewed:  Reviewed past medical,surgical, social  and family history.  Reviewed problem list, medications and allergies. Physical Assessment:   Vitals:   11/15/22 0939  BP: (!) 146/97  Pulse: 81  Weight: 267 lb 12.8 oz (121.5 kg)  Height: 5\' 2"  (1.575 m)  Body mass index is 48.98 kg/m.        Physical Examination:   General appearance - well appearing, and in no distress  Mental status - alert, oriented to person, place, and time  Psych:  She has a normal mood and affect  Skin - warm and dry, normal color, no suspicious lesions noted  Chest - effort normal, all lung fields clear to auscultation bilaterally  Heart - normal rate and regular rhythm  Neck:  midline trachea, no thyromegaly or nodules  Breasts - breasts appear normal, no suspicious masses, no skin or nipple changes or  axillary nodes  Abdomen - soft, nontender, nondistended, no masses or organomegaly  Pelvic - VULVA: normal appearing vulva with no masses, tenderness or lesions  VAGINA: normal appearing vagina with normal color and discharge, no lesions  CERVIX: normal appearing cervix without discharge or lesions, no CMT  Thin prep pap is {Desc; done/not:10129} *** HR HPV cotesting  UTERUS: uterus is felt to be normal size, shape, consistency and nontender   ADNEXA: No adnexal masses or tenderness noted.  Rectal - normal rectal, good sphincter tone, no masses felt. Hemoccult: ***  Extremities:  No swelling or varicosities noted  Chaperone present for exam  No results  found for this or any previous visit (from the past 24 hour(s)).  Assessment & Plan:  1) Well-Woman Exam  2) ***  Labs/procedures today: ***  Mammogram: {Mammo f/u:25212::"@ 57yo"}, or sooner if problems Colonoscopy: {TCS f/u:25213::"@ 57yo"}, or sooner if problems  No orders of the defined types were placed in this encounter.   Meds: No orders of the defined types were placed in this encounter.   Follow-up: No follow-ups on file.  Albertine Grates, FNP 11/15/2022 10:14 AM

## 2022-11-15 NOTE — Progress Notes (Signed)
Pt presents for AEX. Hysterectomy 2003 Right oophrectomy 2005 Last mammogram scheduled today Colonoscopy last year  Pt c/o bilateral breast pain for a few days. Breast feel heavy and inflamed. Pt also reports a pimple on the left breast.

## 2022-11-15 NOTE — Progress Notes (Signed)
Subjective:     Kristin Hahn is a 57 y.o. female here at [facility] for a routine exam.  Current complaints: menopausal symptoms, including hot flashes, mood changes and irritability.  She also reports pain in both breasts with a heaviness and firmness throughout, more on the right breast. There was a pimple on her left breast that has now resolved.  Breast symptoms all started 1 week ago.  She has a lower abdomen/inguinal hernia causing pain in her left lower abdomen.   She is s/p hysterectomy in 2003 for fibroids/AUB.   Personal health questionnaire reviewed: yes.  Do you have a primary care provider? yes Do you feel safe at home? yes  Flowsheet Row Office Visit from 11/15/2022 in [facility] Total Score 3       Health Maintenance Due  Topic Date Due   COVID-19 Vaccine (1) Never done   DTaP/Tdap/Td (1 - Tdap) Never done   COLONOSCOPY (Pts 45-28yrs Insurance coverage will need to be confirmed)  Never done   MAMMOGRAM  Never done   Zoster Vaccines- Shingrix (1 of 2) Never done   PAP SMEAR-Modifier  06/24/2019     Risk factors for chronic health problems: Smoking: Never Alchohol/how much: None Pt BMI: Body mass index is 48.98 kg/m.   Gynecologic History No LMP recorded. Patient has had a hysterectomy. Contraception: status post hysterectomy Last Pap: 2017, with hysterectomy 2003, no further Pap recommended. Last mammogram: 2023 per pt. Results were: normal  Obstetric History OB History  Gravida Para Term Preterm AB Living  2 2 2     2   SAB IAB Ectopic Multiple Live Births          2    # Outcome Date GA Lbr Len/2nd Weight Sex Delivery Anes PTL Lv  2 Term 10/23/87 [redacted]w[redacted]d  6 lb 11 oz (3.033 kg) F Vag-Spont None  LIV  1 Term 08/15/83 [redacted]w[redacted]d  7 lb 3 oz (3.26 kg) F Vag-Spont EPI  LIV     The following portions of the patient's history were reviewed and updated as appropriate: allergies, current medications, past family  history, past medical history, past social history, past surgical history, and problem list.  Review of Systems Pertinent items noted in HPI and remainder of comprehensive ROS otherwise negative.    Objective:  BP (!) 146/97   Pulse 81   Ht 5\' 2"  (1.575 m)   Wt 267 lb 12.8 oz (121.5 kg)   BMI 48.98 kg/m   VS reviewed, nursing note reviewed,  Constitutional: well developed, well nourished, no distress HEENT: normocephalic, thyroid without enlargement or mass HEART: RRR, no murmurs rubs/gallops RESP: clear and equal to auscultation bilaterally in all lobes  Breast Exam:   exam performed: right breast normal without mass, skin or nipple changes or axillary nodes, left breast normal without mass, nipple changes or axillary nodes. Small slightly raised area at lower outer margin of areola, c/w small abscess/folliculitis.  Abdomen: soft Neuro: alert and oriented x 3 Skin: warm, dry Psych: affect normal Pelvic exam:   Bimanual exam: Uterus surgically absent, left adnexa without tenderness, enlargement, or mass, right adnexa not palpable      Assessment/Plan:   1. Well woman exam with routine gynecological exam --Bimanual exam wnl, uterus surgically absent, left ovary palpable and wnl, right ovary absent. --Hysterectomy in 2003 for uterine fibroid and AUB, no malignancy, no further Paps needed  --See hot flashes below - CBC -  Comp Met (CMET)  2. Vasomotor symptoms due to menopause --Pt not a good candidate for hormone replacement but discussed other options, including conservative measures like wearing layers, cotton clothing, using fan, wet washcloth at night. Pt is currently using this without relief. --Discussed use of antidepressants, and pt would like to restart Wellbutrin, which she took in the past. --Also discussed Viozah, to treat hot flashes specifically, and pt would like to try. --Liver enzymes checked for baseline labs and will send Rx if wnl.  3. Hot flash,  menopausal   4. Other specified abdominal hernia without obstruction or gangrene --soft, no evidence of incarceration.  F/U with MD.   5. Mood changes --See above for menopausal symptoms. --Rx for Wellbutrin 150 mg x 2 weeks, the 300 mg daily  - Ambulatory referral to [facility]   7. Acute pain of breast --PT has bilateral breast heaviness and pain x 1 week.  Right breast with more pain, upper outer quadrant.  Left breast with small pimple that has now resolved.   --Diagnostic mammogram and [place] at [facility] today, pt to send records to our office.   6. Current severe episode of major depressive disorder without psychotic features without prior episode --Pt doesn't remember why she stopped taking Wellbutrin but did not have any adverse side effects and is interested in restarting.  - Ambulatory referral to [facility]     Return in about 2 months (around 01/15/2023) for MD only, Female provider preferred.   Sharen Counter, CNM 6:36 PM

## 2022-11-16 LAB — CBC
Hematocrit: 39.8 % (ref 34.0–46.6)
Hemoglobin: 12.8 g/dL (ref 11.1–15.9)
MCH: 27.1 pg (ref 26.6–33.0)
MCHC: 32.2 g/dL (ref 31.5–35.7)
MCV: 84 fL (ref 79–97)
Platelets: 224 10*3/uL (ref 150–450)
RBC: 4.73 x10E6/uL (ref 3.77–5.28)
RDW: 14.4 % (ref 11.7–15.4)
WBC: 7.8 10*3/uL (ref 3.4–10.8)

## 2022-11-16 LAB — COMPREHENSIVE METABOLIC PANEL
ALT: 34 IU/L — ABNORMAL HIGH (ref 0–32)
AST: 38 IU/L (ref 0–40)
Albumin/Globulin Ratio: 1.6 (ref 1.2–2.2)
Albumin: 4.7 g/dL (ref 3.8–4.9)
Alkaline Phosphatase: 90 IU/L (ref 44–121)
BUN/Creatinine Ratio: 14 (ref 9–23)
BUN: 15 mg/dL (ref 6–24)
Bilirubin Total: 0.3 mg/dL (ref 0.0–1.2)
CO2: 21 mmol/L (ref 20–29)
Calcium: 9.9 mg/dL (ref 8.7–10.2)
Chloride: 102 mmol/L (ref 96–106)
Creatinine, Ser: 1.06 mg/dL — ABNORMAL HIGH (ref 0.57–1.00)
Globulin, Total: 3 g/dL (ref 1.5–4.5)
Glucose: 86 mg/dL (ref 70–99)
Potassium: 3.8 mmol/L (ref 3.5–5.2)
Sodium: 142 mmol/L (ref 134–144)
Total Protein: 7.7 g/dL (ref 6.0–8.5)
eGFR: 62 mL/min/{1.73_m2} (ref >59)

## 2022-11-16 MED ORDER — VEOZAH 45 MG PO TABS: 45.0000 mg | ORAL_TABLET | Freq: Every day | ORAL | 3 refills | Status: AC

## 2022-11-16 NOTE — Addendum Note (Signed)
Addended by: Sharen Counter A on: 11/16/2022 06:07 PM   Modules accepted: Orders

## 2022-11-18 ENCOUNTER — Telehealth: Payer: Self-pay

## 2022-11-18 NOTE — Telephone Encounter (Signed)
I connected with  Kristin Hahn on 11/18/22 by telephone and verified that I am speaking with the correct person using two identifiers.  Pt informed of lab results and requested lab appt on 7/9 be changed to 130pm. Change made.

## 2022-11-18 NOTE — Telephone Encounter (Signed)
-----   Message from Hurshel Party, CNM sent at 11/16/2022  6:04 PM EDT ----- I sent a MyChart message to this patient, but she prefers a phone call.  Please call her in the daytime tomorrow and let her know her labs were overall normal and it is safe to start her on the hot flash medicine, Veozah.  I sent the prescription to her pharmacy.  If there are any insurance problems, tell her to call us and I can probably get it for her through one of the mail-order pharmacies.

## 2022-11-22 ENCOUNTER — Ambulatory Visit: Payer: Federal, State, Local not specified - PPO | Admitting: Sports Medicine

## 2022-12-01 ENCOUNTER — Ambulatory Visit: Payer: Self-pay | Admitting: Licensed Clinical Social Worker

## 2022-12-01 DIAGNOSIS — N644 Mastodynia: Secondary | ICD-10-CM | POA: Diagnosis not present

## 2022-12-01 DIAGNOSIS — Z91199 Patient's noncompliance with other medical treatment and regimen due to unspecified reason: Secondary | ICD-10-CM

## 2022-12-01 NOTE — BH Specialist Note (Signed)
Called Ms. Strong at cell phone and work phone. Mychart link sent to phone and email. Pt no show visit

## 2022-12-22 DIAGNOSIS — I10 Essential (primary) hypertension: Secondary | ICD-10-CM | POA: Diagnosis not present

## 2022-12-22 DIAGNOSIS — J4520 Mild intermittent asthma, uncomplicated: Secondary | ICD-10-CM | POA: Diagnosis not present

## 2022-12-22 DIAGNOSIS — R7303 Prediabetes: Secondary | ICD-10-CM | POA: Diagnosis not present

## 2022-12-28 DIAGNOSIS — M5416 Radiculopathy, lumbar region: Secondary | ICD-10-CM | POA: Diagnosis not present

## 2022-12-28 DIAGNOSIS — Z79891 Long term (current) use of opiate analgesic: Secondary | ICD-10-CM | POA: Diagnosis not present

## 2022-12-28 DIAGNOSIS — M5136 Other intervertebral disc degeneration, lumbar region: Secondary | ICD-10-CM | POA: Diagnosis not present

## 2022-12-28 DIAGNOSIS — M47896 Other spondylosis, lumbar region: Secondary | ICD-10-CM | POA: Diagnosis not present

## 2022-12-28 DIAGNOSIS — G894 Chronic pain syndrome: Secondary | ICD-10-CM | POA: Diagnosis not present

## 2023-02-14 ENCOUNTER — Other Ambulatory Visit: Payer: Federal, State, Local not specified - PPO

## 2023-02-20 DIAGNOSIS — J029 Acute pharyngitis, unspecified: Secondary | ICD-10-CM | POA: Diagnosis not present

## 2023-02-20 DIAGNOSIS — E6609 Other obesity due to excess calories: Secondary | ICD-10-CM | POA: Diagnosis not present

## 2023-02-20 DIAGNOSIS — R030 Elevated blood-pressure reading, without diagnosis of hypertension: Secondary | ICD-10-CM | POA: Diagnosis not present

## 2023-02-20 DIAGNOSIS — Z6841 Body Mass Index (BMI) 40.0 and over, adult: Secondary | ICD-10-CM | POA: Diagnosis not present

## 2023-02-20 DIAGNOSIS — H6693 Otitis media, unspecified, bilateral: Secondary | ICD-10-CM | POA: Diagnosis not present

## 2023-03-06 DIAGNOSIS — M47896 Other spondylosis, lumbar region: Secondary | ICD-10-CM | POA: Diagnosis not present

## 2023-03-06 DIAGNOSIS — M5416 Radiculopathy, lumbar region: Secondary | ICD-10-CM | POA: Diagnosis not present

## 2023-03-06 DIAGNOSIS — G894 Chronic pain syndrome: Secondary | ICD-10-CM | POA: Diagnosis not present

## 2023-03-06 DIAGNOSIS — Z79891 Long term (current) use of opiate analgesic: Secondary | ICD-10-CM | POA: Diagnosis not present

## 2023-03-06 DIAGNOSIS — M5136 Other intervertebral disc degeneration, lumbar region: Secondary | ICD-10-CM | POA: Diagnosis not present

## 2023-03-06 DIAGNOSIS — M791 Myalgia, unspecified site: Secondary | ICD-10-CM | POA: Diagnosis not present

## 2023-04-20 DIAGNOSIS — Z79891 Long term (current) use of opiate analgesic: Secondary | ICD-10-CM | POA: Diagnosis not present

## 2023-04-20 DIAGNOSIS — M47896 Other spondylosis, lumbar region: Secondary | ICD-10-CM | POA: Diagnosis not present

## 2023-04-20 DIAGNOSIS — M5136 Other intervertebral disc degeneration, lumbar region: Secondary | ICD-10-CM | POA: Diagnosis not present

## 2023-04-20 DIAGNOSIS — M5416 Radiculopathy, lumbar region: Secondary | ICD-10-CM | POA: Diagnosis not present

## 2023-04-20 DIAGNOSIS — M791 Myalgia, unspecified site: Secondary | ICD-10-CM | POA: Diagnosis not present

## 2023-04-20 DIAGNOSIS — G894 Chronic pain syndrome: Secondary | ICD-10-CM | POA: Diagnosis not present

## 2023-05-30 DIAGNOSIS — R7303 Prediabetes: Secondary | ICD-10-CM | POA: Diagnosis not present

## 2023-05-30 DIAGNOSIS — Z23 Encounter for immunization: Secondary | ICD-10-CM | POA: Diagnosis not present

## 2023-05-30 DIAGNOSIS — M19012 Primary osteoarthritis, left shoulder: Secondary | ICD-10-CM | POA: Diagnosis not present

## 2023-05-30 DIAGNOSIS — I10 Essential (primary) hypertension: Secondary | ICD-10-CM | POA: Diagnosis not present

## 2023-05-30 DIAGNOSIS — J4520 Mild intermittent asthma, uncomplicated: Secondary | ICD-10-CM | POA: Diagnosis not present

## 2023-06-07 ENCOUNTER — Ambulatory Visit: Payer: Federal, State, Local not specified - PPO | Admitting: Family Medicine

## 2023-06-20 DIAGNOSIS — G894 Chronic pain syndrome: Secondary | ICD-10-CM | POA: Diagnosis not present

## 2023-06-20 DIAGNOSIS — Z5181 Encounter for therapeutic drug level monitoring: Secondary | ICD-10-CM | POA: Diagnosis not present

## 2023-06-20 DIAGNOSIS — Z79891 Long term (current) use of opiate analgesic: Secondary | ICD-10-CM | POA: Diagnosis not present

## 2023-06-28 DIAGNOSIS — Z79891 Long term (current) use of opiate analgesic: Secondary | ICD-10-CM | POA: Diagnosis not present

## 2023-06-28 DIAGNOSIS — M791 Myalgia, unspecified site: Secondary | ICD-10-CM | POA: Diagnosis not present

## 2023-06-28 DIAGNOSIS — G894 Chronic pain syndrome: Secondary | ICD-10-CM | POA: Diagnosis not present

## 2023-06-28 DIAGNOSIS — M5416 Radiculopathy, lumbar region: Secondary | ICD-10-CM | POA: Diagnosis not present

## 2023-06-28 DIAGNOSIS — M47896 Other spondylosis, lumbar region: Secondary | ICD-10-CM | POA: Diagnosis not present

## 2023-06-29 DIAGNOSIS — I10 Essential (primary) hypertension: Secondary | ICD-10-CM | POA: Diagnosis not present

## 2023-06-29 DIAGNOSIS — J4520 Mild intermittent asthma, uncomplicated: Secondary | ICD-10-CM | POA: Diagnosis not present

## 2023-06-29 DIAGNOSIS — M5010 Cervical disc disorder with radiculopathy, unspecified cervical region: Secondary | ICD-10-CM | POA: Diagnosis not present

## 2023-06-29 DIAGNOSIS — R7303 Prediabetes: Secondary | ICD-10-CM | POA: Diagnosis not present

## 2023-07-12 DIAGNOSIS — M4120 Other idiopathic scoliosis, site unspecified: Secondary | ICD-10-CM | POA: Diagnosis not present

## 2023-07-12 DIAGNOSIS — M25512 Pain in left shoulder: Secondary | ICD-10-CM | POA: Diagnosis not present

## 2023-08-01 DIAGNOSIS — M47812 Spondylosis without myelopathy or radiculopathy, cervical region: Secondary | ICD-10-CM | POA: Diagnosis not present

## 2023-08-01 DIAGNOSIS — R936 Abnormal findings on diagnostic imaging of limbs: Secondary | ICD-10-CM | POA: Diagnosis not present

## 2023-08-01 DIAGNOSIS — M19012 Primary osteoarthritis, left shoulder: Secondary | ICD-10-CM | POA: Diagnosis not present

## 2023-08-21 DIAGNOSIS — M5010 Cervical disc disorder with radiculopathy, unspecified cervical region: Secondary | ICD-10-CM | POA: Diagnosis not present

## 2023-08-21 DIAGNOSIS — N390 Urinary tract infection, site not specified: Secondary | ICD-10-CM | POA: Diagnosis not present

## 2023-08-21 DIAGNOSIS — J4520 Mild intermittent asthma, uncomplicated: Secondary | ICD-10-CM | POA: Diagnosis not present

## 2023-08-21 DIAGNOSIS — I10 Essential (primary) hypertension: Secondary | ICD-10-CM | POA: Diagnosis not present

## 2023-09-01 DIAGNOSIS — R7303 Prediabetes: Secondary | ICD-10-CM | POA: Diagnosis not present

## 2023-09-01 DIAGNOSIS — I517 Cardiomegaly: Secondary | ICD-10-CM | POA: Diagnosis not present

## 2023-09-01 DIAGNOSIS — Z0000 Encounter for general adult medical examination without abnormal findings: Secondary | ICD-10-CM | POA: Diagnosis not present

## 2023-09-01 DIAGNOSIS — R946 Abnormal results of thyroid function studies: Secondary | ICD-10-CM | POA: Diagnosis not present

## 2023-09-01 DIAGNOSIS — R300 Dysuria: Secondary | ICD-10-CM | POA: Diagnosis not present

## 2023-09-01 DIAGNOSIS — Z114 Encounter for screening for human immunodeficiency virus [HIV]: Secondary | ICD-10-CM | POA: Diagnosis not present

## 2023-09-01 DIAGNOSIS — Z1159 Encounter for screening for other viral diseases: Secondary | ICD-10-CM | POA: Diagnosis not present

## 2023-09-06 DIAGNOSIS — M47896 Other spondylosis, lumbar region: Secondary | ICD-10-CM | POA: Diagnosis not present

## 2023-09-06 DIAGNOSIS — M5416 Radiculopathy, lumbar region: Secondary | ICD-10-CM | POA: Diagnosis not present

## 2023-09-06 DIAGNOSIS — G894 Chronic pain syndrome: Secondary | ICD-10-CM | POA: Diagnosis not present

## 2023-09-07 DIAGNOSIS — Z6841 Body Mass Index (BMI) 40.0 and over, adult: Secondary | ICD-10-CM | POA: Diagnosis not present

## 2023-09-07 DIAGNOSIS — M1A9XX0 Chronic gout, unspecified, without tophus (tophi): Secondary | ICD-10-CM | POA: Diagnosis not present

## 2023-09-07 DIAGNOSIS — Z0000 Encounter for general adult medical examination without abnormal findings: Secondary | ICD-10-CM | POA: Diagnosis not present

## 2023-09-07 DIAGNOSIS — E559 Vitamin D deficiency, unspecified: Secondary | ICD-10-CM | POA: Diagnosis not present

## 2023-09-07 DIAGNOSIS — R7303 Prediabetes: Secondary | ICD-10-CM | POA: Diagnosis not present

## 2023-09-07 DIAGNOSIS — Z7189 Other specified counseling: Secondary | ICD-10-CM | POA: Diagnosis not present

## 2023-09-07 DIAGNOSIS — E0842 Diabetes mellitus due to underlying condition with diabetic polyneuropathy: Secondary | ICD-10-CM | POA: Diagnosis not present

## 2023-09-07 DIAGNOSIS — I10 Essential (primary) hypertension: Secondary | ICD-10-CM | POA: Diagnosis not present

## 2023-09-29 DIAGNOSIS — F4323 Adjustment disorder with mixed anxiety and depressed mood: Secondary | ICD-10-CM | POA: Diagnosis not present

## 2023-09-29 DIAGNOSIS — F4389 Other reactions to severe stress: Secondary | ICD-10-CM | POA: Diagnosis not present

## 2023-10-06 DIAGNOSIS — F4323 Adjustment disorder with mixed anxiety and depressed mood: Secondary | ICD-10-CM | POA: Diagnosis not present

## 2023-10-06 DIAGNOSIS — F4389 Other reactions to severe stress: Secondary | ICD-10-CM | POA: Diagnosis not present

## 2023-10-10 DIAGNOSIS — M19012 Primary osteoarthritis, left shoulder: Secondary | ICD-10-CM | POA: Diagnosis not present

## 2023-11-06 DIAGNOSIS — G894 Chronic pain syndrome: Secondary | ICD-10-CM | POA: Diagnosis not present

## 2023-11-06 DIAGNOSIS — Z79891 Long term (current) use of opiate analgesic: Secondary | ICD-10-CM | POA: Diagnosis not present

## 2023-11-06 DIAGNOSIS — M5416 Radiculopathy, lumbar region: Secondary | ICD-10-CM | POA: Diagnosis not present

## 2023-11-06 DIAGNOSIS — M47896 Other spondylosis, lumbar region: Secondary | ICD-10-CM | POA: Diagnosis not present

## 2023-11-23 DIAGNOSIS — N200 Calculus of kidney: Secondary | ICD-10-CM | POA: Diagnosis not present

## 2023-11-23 DIAGNOSIS — I10 Essential (primary) hypertension: Secondary | ICD-10-CM | POA: Diagnosis not present

## 2023-11-23 DIAGNOSIS — R7303 Prediabetes: Secondary | ICD-10-CM | POA: Diagnosis not present

## 2023-12-26 DIAGNOSIS — J4520 Mild intermittent asthma, uncomplicated: Secondary | ICD-10-CM | POA: Diagnosis not present

## 2023-12-26 DIAGNOSIS — I10 Essential (primary) hypertension: Secondary | ICD-10-CM | POA: Diagnosis not present

## 2023-12-26 DIAGNOSIS — K5903 Drug induced constipation: Secondary | ICD-10-CM | POA: Diagnosis not present

## 2023-12-26 DIAGNOSIS — G894 Chronic pain syndrome: Secondary | ICD-10-CM | POA: Diagnosis not present

## 2024-01-10 DIAGNOSIS — M47896 Other spondylosis, lumbar region: Secondary | ICD-10-CM | POA: Diagnosis not present

## 2024-01-10 DIAGNOSIS — G894 Chronic pain syndrome: Secondary | ICD-10-CM | POA: Diagnosis not present

## 2024-01-10 DIAGNOSIS — M5416 Radiculopathy, lumbar region: Secondary | ICD-10-CM | POA: Diagnosis not present

## 2024-01-10 DIAGNOSIS — Z79891 Long term (current) use of opiate analgesic: Secondary | ICD-10-CM | POA: Diagnosis not present

## 2024-02-19 ENCOUNTER — Ambulatory Visit: Admitting: Podiatry

## 2024-03-06 DIAGNOSIS — G894 Chronic pain syndrome: Secondary | ICD-10-CM | POA: Diagnosis not present

## 2024-03-06 DIAGNOSIS — M5416 Radiculopathy, lumbar region: Secondary | ICD-10-CM | POA: Diagnosis not present

## 2024-03-06 DIAGNOSIS — M47896 Other spondylosis, lumbar region: Secondary | ICD-10-CM | POA: Diagnosis not present

## 2024-03-06 DIAGNOSIS — Z79891 Long term (current) use of opiate analgesic: Secondary | ICD-10-CM | POA: Diagnosis not present

## 2024-03-11 ENCOUNTER — Encounter: Payer: Self-pay | Admitting: Neurology

## 2024-03-15 ENCOUNTER — Encounter: Payer: Self-pay | Admitting: Emergency Medicine

## 2024-03-15 ENCOUNTER — Ambulatory Visit
Admission: EM | Admit: 2024-03-15 | Discharge: 2024-03-15 | Disposition: A | Discharge status: Home / Self Care | Attending: Family Medicine | Admitting: Family Medicine

## 2024-03-15 DIAGNOSIS — H65192 Other acute nonsuppurative otitis media, left ear: Secondary | ICD-10-CM | POA: Diagnosis not present

## 2024-03-15 DIAGNOSIS — H60392 Other infective otitis externa, left ear: Secondary | ICD-10-CM

## 2024-03-15 MED ORDER — AMOXICILLIN 875 MG PO TABS: 875.0000 mg | ORAL_TABLET | Freq: Two times a day (BID) | ORAL | 0 refills | Status: DC

## 2024-03-15 MED ORDER — CIPROFLOXACIN-DEXAMETHASONE 0.3-0.1 % OT SUSP: 4.0000 [drp] | Freq: Two times a day (BID) | OTIC | 0 refills | Status: DC

## 2024-03-15 NOTE — ED Provider Notes (Signed)
 [facility]  Note:  This document was prepared using Conservation officer, historic buildings and may include unintentional dictation errors.  MRN: [id number] DOB: February 19, 1966  Subjective:   Kristin Hahn is a 58 y.o. female presenting for 2 day history of acute onset left ear pain, slight drainage, left ear fullness, decreased hearing. No fever, tinnitus, dizziness, sore throat, runny or stuffy nose, cough. No smoking of any kind including cigarettes, cigars, vaping, marijuana use.  Has seasonal allergies but is not really affecting patient for the past few years.   No current facility-administered medications for this encounter.  Current Outpatient Medications:    albuterol  (PROVENTIL  HFA;VENTOLIN  HFA) 108 (90 Base) MCG/ACT inhaler, Inhale 2 puffs into the lungs every 4 (four) hours as needed. Wheezing and shortness of breath, Disp: 1 Inhaler, Rfl: 5   albuterol  (PROVENTIL ) (2.5 MG/3ML) 0.083% nebulizer solution, Take 3 mLs (2.5 mg total) by nebulization every 4 (four) hours as needed for wheezing or shortness of breath., Disp: 75 mL, Rfl: 5   amLODipine (NORVASC) 5 MG tablet, Take 5 mg by mouth daily., Disp: , Rfl:    buPROPion  (WELLBUTRIN  SR) 150 MG 12 hr tablet, Take 1 tablet (150 mg total) by mouth 2 (two) times daily for 14 days., Disp: 14 tablet, Rfl: 0   buPROPion  (WELLBUTRIN  XL) 300 MG 24 hr tablet, Take 1 tablet (300 mg total) by mouth daily., Disp: 30 tablet, Rfl: 11   cloNIDine  (CATAPRES ) 0.1 MG tablet, Take 0.1 mg by mouth daily as needed (sleep). , Disp: , Rfl:    diclofenac (VOLTAREN) 75 MG EC tablet, TK 1 T PO BID WF OR MILK AND WF PRN, Disp: , Rfl:    diclofenac sodium (VOLTAREN) 1 % GEL, APP 2 -4 GRAMS AA QID PRN, Disp: , Rfl:    Fezolinetant  (VEOZAH ) 45 MG TABS, Take 1 tablet (45 mg total) by mouth daily., Disp: 30 tablet, Rfl: 3   fluticasone  furoate-vilanterol (BREO ELLIPTA ) 200-25 MCG/INH AEPB, Inhale 1 puff into the lungs daily. (Patient not taking:  Reported on 11/15/2022), Disp: 60 each, Rfl: 5   LINZESS 145 MCG CAPS capsule, TK ONE C PO  D PRN, Disp: , Rfl:    olmesartan (BENICAR) 40 MG tablet, 1 tablet, Disp: , Rfl:    ondansetron  (ZOFRAN -ODT) 4 MG disintegrating tablet, Take 1 tablet (4 mg total) by mouth every 8 (eight) hours as needed., Disp: 20 tablet, Rfl: 0   oxyCODONE-acetaminophen (PERCOCET) 10-325 MG tablet, TK 1 T PO  Q 6 H AND AT BEDTIME AS NEEDED, Disp: , Rfl:    triamcinolone  ointment (KENALOG ) 0.5 %, Apply topically 2 (two) times daily., Disp: 90 g, Rfl: 1   Vitamin D, Ergocalciferol, (DRISDOL) 1.25 MG (50000 UT) CAPS capsule, , Disp: , Rfl:    Allergies  Allergen Reactions   Erythromycin Base Nausea And Vomiting    Past Medical History:  Diagnosis Date   Asthma    Facet joint disease    Gout    Hypertension    Kidney stone    Renal disorder      Past Surgical History:  Procedure Laterality Date   ABDOMINAL HYSTERECTOMY     ADENOIDECTOMY     keloid removal     kidney stone removal     oophrectomy     right   SPINAL CORD STIMULATOR INSERTION  2020   TONSILLECTOMY     TUBAL LIGATION      Family History  Problem Relation Age of Onset  Diabetes Mother    Hypertension Mother    Breast cancer Mother        2017, 2020   Diabetes Father    Hypertension Father    Eczema Daughter    Asthma Daughter    Allergic rhinitis Neg Hx    Angioedema Neg Hx    Immunodeficiency Neg Hx    Urticaria Neg Hx     Social History   Tobacco Use   Smoking status: Never    Passive exposure: Never   Smokeless tobacco: Never  Vaping Use   Vaping status: Never Used  Substance Use Topics   Alcohol  use: No    Alcohol /week: 0.0 standard drinks of alcohol    Drug use: No    ROS   Objective:   Vitals: BP (!) 166/85 (BP Location: Left Arm)   Pulse 98   Temp 98 F (36.7 C) (Oral)   Resp 18   Wt 229 lb (103.9 kg)   SpO2 98%   BMI 41.88 kg/m   Physical Exam Constitutional:      General: She is not in acute  distress.    Appearance: Normal appearance. She is well-developed. She is not ill-appearing, toxic-appearing or diaphoretic.  HENT:     Head: Normocephalic and atraumatic.     Right Ear: Tympanic membrane, ear canal and external ear normal. No tenderness. There is no impacted cerumen. Tympanic membrane is not injected, perforated, erythematous or bulging.     Left Ear: No tenderness. There is no impacted cerumen. Tympanic membrane is not injected, perforated, erythematous or bulging.     Ears:     Comments: TM with effusion, clumpy white drainage of the ear canal with 1+ swelling distally and tenderness.    Nose: Nose normal.     Mouth/Throat:     Mouth: Mucous membranes are moist.  Eyes:     General: No scleral icterus.       Right eye: No discharge.        Left eye: No discharge.     Extraocular Movements: Extraocular movements intact.  Cardiovascular:     Rate and Rhythm: Normal rate.  Pulmonary:     Effort: Pulmonary effort is normal.  Skin:    General: Skin is warm and dry.  Neurological:     General: No focal deficit present.     Mental Status: She is alert and oriented to person, place, and time.  Psychiatric:        Mood and Affect: Mood normal.        Behavior: Behavior normal.     Assessment and Plan :   PDMP not reviewed this encounter.  1. Other non-recurrent acute nonsuppurative otitis media of left ear   2. Infective otitis externa of left ear    Recommended covering for middle ear infection with concurrent otitis externa.  Start amoxicillin , Ciprodex .  Counseled patient on potential for adverse effects with medications prescribed/recommended today, ER and return-to-clinic precautions discussed, patient verbalized understanding.    Christopher Savannah, [place] 03/15/24 1930

## 2024-03-15 NOTE — Discharge Instructions (Signed)
 Take amoxicillin  for the middle ear infection and use the ear drops for the otitis externa, infection in the ear canal.

## 2024-03-15 NOTE — ED Triage Notes (Signed)
 Pt presents c/o left ear pain x 2 days. Pt reports she has not tried any OTC products. Pt denies dizziness. SABRA

## 2024-03-20 ENCOUNTER — Encounter: Payer: Self-pay | Admitting: Family Medicine

## 2024-03-20 ENCOUNTER — Ambulatory Visit (INDEPENDENT_AMBULATORY_CARE_PROVIDER_SITE_OTHER): Admitting: Family Medicine

## 2024-03-20 VITALS — BP 115/72 | HR 96 | Ht 62.0 in | Wt 234.4 lb

## 2024-03-20 DIAGNOSIS — F4321 Adjustment disorder with depressed mood: Secondary | ICD-10-CM

## 2024-03-20 DIAGNOSIS — F411 Generalized anxiety disorder: Secondary | ICD-10-CM

## 2024-03-20 DIAGNOSIS — H9192 Unspecified hearing loss, left ear: Secondary | ICD-10-CM

## 2024-03-20 DIAGNOSIS — H9202 Otalgia, left ear: Secondary | ICD-10-CM

## 2024-03-20 DIAGNOSIS — Z7689 Persons encountering health services in other specified circumstances: Secondary | ICD-10-CM

## 2024-03-20 MED ORDER — CIPROFLOXACIN-DEXAMETHASONE 0.3-0.1 % OT SUSP: 4.0000 [drp] | Freq: Two times a day (BID) | OTIC | 0 refills | Status: AC

## 2024-03-20 NOTE — Progress Notes (Deleted)
 Established Patient Office Visit  Subjective    Patient ID: Kristin Hahn, female    DOB: 09-25-1965  Age: 59 y.o. MRN: [id number]  CC:  Chief Complaint  Patient presents with  . ear infection    Pt reports she was told on Friday at [facility] she had an ear infection. She was given amoxcillan for it but it made her sick so she did not complete. She reports she still has infection in left ear and cannot hear out of that ear.     HPI Kristin Hahn presents ***  Outpatient Encounter Medications as of 03/20/2024  Medication Sig  . albuterol  (PROVENTIL  HFA;VENTOLIN  HFA) 108 (90 Base) MCG/ACT inhaler Inhale 2 puffs into the lungs every 4 (four) hours as needed. Wheezing and shortness of breath  . albuterol  (PROVENTIL ) (2.5 MG/3ML) 0.083% nebulizer solution Take 3 mLs (2.5 mg total) by nebulization every 4 (four) hours as needed for wheezing or shortness of breath.  SABRA amLODipine (NORVASC) 5 MG tablet Take 5 mg by mouth daily.  . amoxicillin  (AMOXIL ) 875 MG tablet Take 1 tablet (875 mg total) by mouth 2 (two) times daily.  . buPROPion  (WELLBUTRIN  SR) 150 MG 12 hr tablet Take 1 tablet (150 mg total) by mouth 2 (two) times daily for 14 days.  . buPROPion  (WELLBUTRIN  XL) 300 MG 24 hr tablet Take 1 tablet (300 mg total) by mouth daily.  . ciprofloxacin -dexamethasone  (CIPRODEX ) OTIC suspension Place 4 drops into the left ear 2 (two) times daily.  . cloNIDine  (CATAPRES ) 0.1 MG tablet Take 0.1 mg by mouth daily as needed (sleep).   . diclofenac (VOLTAREN) 75 MG EC tablet TK 1 T PO BID WF OR MILK AND WF PRN  . diclofenac sodium (VOLTAREN) 1 % GEL APP 2 -4 GRAMS AA QID PRN  . Fezolinetant  (VEOZAH ) 45 MG TABS Take 1 tablet (45 mg total) by mouth daily.  . fluticasone  furoate-vilanterol (BREO ELLIPTA ) 200-25 MCG/INH AEPB Inhale 1 puff into the lungs daily.  SABRA LINZESS 145 MCG CAPS capsule TK ONE C PO  D PRN  . olmesartan (BENICAR) 40 MG tablet 1 tablet  . ondansetron  (ZOFRAN -ODT) 4 MG disintegrating  tablet Take 1 tablet (4 mg total) by mouth every 8 (eight) hours as needed.  SABRA oxyCODONE-acetaminophen (PERCOCET) 10-325 MG tablet TK 1 T PO  Q 6 H AND AT BEDTIME AS NEEDED  . triamcinolone  ointment (KENALOG ) 0.5 % Apply topically 2 (two) times daily.  . Vitamin D, Ergocalciferol, (DRISDOL) 1.25 MG (50000 UT) CAPS capsule   . [DISCONTINUED] norethindrone  (MICRONOR ,CAMILA ,ERRIN ) 0.35 MG tablet Take 1 tablet (0.35 mg total) by mouth daily. (Patient not taking: Reported on 07/30/2014)   No facility-administered encounter medications on file as of 03/20/2024.    Past Medical History:  Diagnosis Date  . Asthma   . Facet joint disease   . Gout   . Hypertension   . Kidney stone   . Renal disorder     Past Surgical History:  Procedure Laterality Date  . ABDOMINAL HYSTERECTOMY    . ADENOIDECTOMY    . keloid removal    . kidney stone removal    . oophrectomy     right  . SPINAL CORD STIMULATOR INSERTION  2020  . TONSILLECTOMY    . TUBAL LIGATION      Family History  Problem Relation Age of Onset  . Diabetes Mother   . Hypertension Mother   . Breast cancer Mother        2017,  2020  . Diabetes Father   . Hypertension Father   . Eczema Daughter   . Asthma Daughter   . Allergic rhinitis Neg Hx   . Angioedema Neg Hx   . Immunodeficiency Neg Hx   . Urticaria Neg Hx     Social History   Socioeconomic History  . Marital status: Married    Spouse name: Not on file  . Number of children: Not on file  . Years of education: Not on file  . Highest education level: Not on file  Occupational History  . Not on file  Tobacco Use  . Smoking status: Never    Passive exposure: Never  . Smokeless tobacco: Never  Vaping Use  . Vaping status: Never Used  Substance and Sexual Activity  . Alcohol  use: No    Alcohol /week: 0.0 standard drinks of alcohol   . Drug use: No  . Sexual activity: Not Currently    Partners: Male    Birth control/protection: Surgical  Other Topics Concern  .  Not on file  Social History Narrative  . Not on file   Social Drivers of Health   Financial Resource Strain: Not on file  Food Insecurity: Not on file  Transportation Needs: Not on file  Physical Activity: Not on file  Stress: Not on file  Social Connections: Not on file  Intimate Partner Violence: Not on file    ROS      Objective    BP 115/72   Pulse 96   Ht 5' 2 (1.575 m)   Wt 234 lb 6.4 oz (106.3 kg)   SpO2 95%   BMI 42.87 kg/m   Physical Exam  {Labs (Optional):23779}    Assessment & Plan:   There are no diagnoses linked to this encounter.   No follow-ups on file.   Tanda Raguel SQUIBB, MD

## 2024-03-22 NOTE — Progress Notes (Signed)
 New Patient Office Visit  Subjective    Patient ID: Kristin Hahn, female    DOB: 05-28-1966  Age: 58 y.o. MRN: [id number]  CC:  Chief Complaint  Patient presents with   ear infection    Pt reports she was told on Friday at [facility] she had an ear infection. She was given amoxcillan for it but it made her sick so she did not complete. She reports she still has infection in left ear and cannot hear out of that ear.     HPI Kristin Hahn presents to establish care and for complaint of left ear pain. Patient was seen in UC for sx and was given amox as well as ciprodex . She reports some GI upset with the amox but has continued the ciprodex . She denies fever.chills or viral sx. She reports that she is presently unable to hear out of her left ear. Patient also reports some depression and anxiety since recent loss of her grandson in MVA but she has not started lexapro  that was prescribed for her.    Outpatient Encounter Medications as of 03/20/2024  Medication Sig   albuterol  (PROVENTIL  HFA;VENTOLIN  HFA) 108 (90 Base) MCG/ACT inhaler Inhale 2 puffs into the lungs every 4 (four) hours as needed. Wheezing and shortness of breath   albuterol  (PROVENTIL ) (2.5 MG/3ML) 0.083% nebulizer solution Take 3 mLs (2.5 mg total) by nebulization every 4 (four) hours as needed for wheezing or shortness of breath.   amLODipine (NORVASC) 5 MG tablet Take 5 mg by mouth daily.   amoxicillin  (AMOXIL ) 875 MG tablet Take 1 tablet (875 mg total) by mouth 2 (two) times daily.   buPROPion  (WELLBUTRIN  SR) 150 MG 12 hr tablet Take 1 tablet (150 mg total) by mouth 2 (two) times daily for 14 days.   buPROPion  (WELLBUTRIN  XL) 300 MG 24 hr tablet Take 1 tablet (300 mg total) by mouth daily.   cloNIDine  (CATAPRES ) 0.1 MG tablet Take 0.1 mg by mouth daily as needed (sleep).    diclofenac (VOLTAREN) 75 MG EC tablet TK 1 T PO BID WF OR MILK AND WF PRN   diclofenac sodium (VOLTAREN) 1 % GEL APP 2 -4 GRAMS AA QID PRN   Fezolinetant   (VEOZAH ) 45 MG TABS Take 1 tablet (45 mg total) by mouth daily.   fluticasone  furoate-vilanterol (BREO ELLIPTA ) 200-25 MCG/INH AEPB Inhale 1 puff into the lungs daily.   LINZESS 145 MCG CAPS capsule TK ONE C PO  D PRN   olmesartan (BENICAR) 40 MG tablet 1 tablet   ondansetron  (ZOFRAN -ODT) 4 MG disintegrating tablet Take 1 tablet (4 mg total) by mouth every 8 (eight) hours as needed.   oxyCODONE-acetaminophen (PERCOCET) 10-325 MG tablet TK 1 T PO  Q 6 H AND AT BEDTIME AS NEEDED   triamcinolone  ointment (KENALOG ) 0.5 % Apply topically 2 (two) times daily.   Vitamin D, Ergocalciferol, (DRISDOL) 1.25 MG (50000 UT) CAPS capsule    [DISCONTINUED] ciprofloxacin -dexamethasone  (CIPRODEX ) OTIC suspension Place 4 drops into the left ear 2 (two) times daily.   ciprofloxacin -dexamethasone  (CIPRODEX ) OTIC suspension Place 4 drops into the left ear 2 (two) times daily.   [DISCONTINUED] norethindrone  (MICRONOR ,CAMILA ,ERRIN ) 0.35 MG tablet Take 1 tablet (0.35 mg total) by mouth daily. (Patient not taking: Reported on 07/30/2014)   No facility-administered encounter medications on file as of 03/20/2024.    Past Medical History:  Diagnosis Date   Asthma    Facet joint disease    Gout    Hypertension  Kidney stone    Renal disorder     Past Surgical History:  Procedure Laterality Date   ABDOMINAL HYSTERECTOMY     ADENOIDECTOMY     keloid removal     kidney stone removal     oophrectomy     right   SPINAL CORD STIMULATOR INSERTION  2020   TONSILLECTOMY     TUBAL LIGATION      Family History  Problem Relation Age of Onset   Diabetes Mother    Hypertension Mother    Breast cancer Mother        2017, 2020   Diabetes Father    Hypertension Father    Eczema Daughter    Asthma Daughter    Allergic rhinitis Neg Hx    Angioedema Neg Hx    Immunodeficiency Neg Hx    Urticaria Neg Hx     Social History   Socioeconomic History   Marital status: Married    Spouse name: Not on file   Number  of children: Not on file   Years of education: Not on file   Highest education level: Not on file  Occupational History   Not on file  Tobacco Use   Smoking status: Never    Passive exposure: Never   Smokeless tobacco: Never  Vaping Use   Vaping status: Never Used  Substance and Sexual Activity   Alcohol  use: No    Alcohol /week: 0.0 standard drinks of alcohol    Drug use: No   Sexual activity: Not Currently    Partners: Male    Birth control/protection: Surgical  Other Topics Concern   Not on file  Social History Narrative   Not on file   Social Drivers of Health   Financial Resource Strain: Not on file  Food Insecurity: Not on file  Transportation Needs: Not on file  Physical Activity: Not on file  Stress: Not on file  Social Connections: Not on file  Intimate Partner Violence: Not on file    Review of Systems  Constitutional:  Negative for chills and fever.  HENT:  Positive for ear pain and hearing loss. Negative for congestion.   Psychiatric/Behavioral:  Positive for depression. Negative for suicidal ideas. The patient is nervous/anxious.   All other systems reviewed and are negative.       Objective   BP 115/72   Pulse 96   Ht 5' 2 (1.575 m)   Wt 234 lb 6.4 oz (106.3 kg)   SpO2 95%   BMI 42.87 kg/m   Physical Exam Vitals and nursing note reviewed.  Constitutional:      General: She is not in acute distress. HENT:     Right Ear: Tympanic membrane, ear canal and external ear normal.     Left Ear: Decreased hearing noted. Tenderness present.     Ears:     Comments: Left canals with minimal swelling - no erythema Cardiovascular:     Rate and Rhythm: Normal rate and regular rhythm.  Pulmonary:     Effort: Pulmonary effort is normal.     Breath sounds: Normal breath sounds.  Abdominal:     Palpations: Abdomen is soft.     Tenderness: There is no abdominal tenderness.  Neurological:     General: No focal deficit present.     Mental Status: She is  alert and oriented to person, place, and time.  Psychiatric:        Mood and Affect: Mood normal.  Assessment & Plan:   1. Left ear pain (Primary) Referral for further eval/mgt. Ciprodex  refilled.  - Ambulatory referral to ENT  2. Hearing loss of left ear, unspecified hearing loss type As above - Ambulatory referral to ENT  3. Situational depression Patient defers lexapro  that had been prescribed previously - continue counseling  4. Anxiety state As above  5. Encounter to establish care   No follow-ups on file.   Tanda Raguel SQUIBB, MD

## 2024-03-23 ENCOUNTER — Ambulatory Visit (HOSPITAL_BASED_OUTPATIENT_CLINIC_OR_DEPARTMENT_OTHER): Admitting: Physical Therapy

## 2024-04-10 ENCOUNTER — Ambulatory Visit: Payer: Self-pay | Admitting: Family Medicine

## 2024-04-15 DIAGNOSIS — M5030 Other cervical disc degeneration, unspecified cervical region: Secondary | ICD-10-CM | POA: Diagnosis not present

## 2024-04-15 DIAGNOSIS — M4802 Spinal stenosis, cervical region: Secondary | ICD-10-CM | POA: Diagnosis not present

## 2024-04-17 ENCOUNTER — Other Ambulatory Visit: Payer: Self-pay | Admitting: Family Medicine

## 2024-04-17 MED ORDER — PREDNISONE 10 MG (21) PO TBPK: ORAL_TABLET | ORAL | 0 refills | Status: AC

## 2024-04-17 MED ORDER — CEFDINIR 300 MG PO CAPS: 300.0000 mg | ORAL_CAPSULE | Freq: Two times a day (BID) | ORAL | 0 refills | Status: AC

## 2024-04-19 ENCOUNTER — Telehealth (INDEPENDENT_AMBULATORY_CARE_PROVIDER_SITE_OTHER): Admitting: Family Medicine

## 2024-04-19 DIAGNOSIS — R4586 Emotional lability: Secondary | ICD-10-CM

## 2024-04-19 DIAGNOSIS — F322 Major depressive disorder, single episode, severe without psychotic features: Secondary | ICD-10-CM | POA: Diagnosis not present

## 2024-04-19 MED ORDER — BUPROPION HCL ER (XL) 300 MG PO TB24: 300.0000 mg | ORAL_TABLET | Freq: Every day | ORAL | 5 refills | Status: AC

## 2024-04-19 NOTE — Progress Notes (Signed)
 Virtual Visit via Video Note  I connected with Kristin Hahn on 04/19/24 at 11:00 AM EDT by a video enabled telemedicine application and verified that I am speaking with the correct person using two identifiers.  Location: Patient: [name] Provider: [place]   I discussed the limitations of evaluation and management by telemedicine and the availability of in person appointments. The patient expressed understanding and agreed to proceed.  History of Present Illness:  Patient reports that since she started celexa  she goes to the bathroom 10+ times at night. She would like to change the medication.    Observations/Objective:   Assessment and Plan: 1. Mood changes D/c celexa . Start wellbutrin  - buPROPion  (WELLBUTRIN  XL) 300 MG 24 hr tablet; Take 1 tablet (300 mg total) by mouth daily.  Dispense: 30 tablet; Refill: 5  2. Current severe episode of major depressive disorder without psychotic features without prior episode (HCC)  - buPROPion  (WELLBUTRIN  XL) 300 MG 24 hr tablet; Take 1 tablet (300 mg total) by mouth daily.  Dispense: 30 tablet; Refill: 5    Follow Up Instructions:    I discussed the assessment and treatment plan with the patient. The patient was provided an opportunity to ask questions and all were answered. The patient agreed with the plan and demonstrated an understanding of the instructions.   The patient was advised to call back or seek an in-person evaluation if the symptoms worsen or if the condition fails to improve as anticipated.  I provided 12 minutes of non-face-to-face time during this encounter.   Tanda Raguel SQUIBB, MD

## 2024-04-22 ENCOUNTER — Encounter: Payer: Self-pay | Admitting: Family Medicine

## 2024-04-29 DIAGNOSIS — G894 Chronic pain syndrome: Secondary | ICD-10-CM | POA: Diagnosis not present

## 2024-04-29 DIAGNOSIS — Z79891 Long term (current) use of opiate analgesic: Secondary | ICD-10-CM | POA: Diagnosis not present

## 2024-04-29 DIAGNOSIS — M47896 Other spondylosis, lumbar region: Secondary | ICD-10-CM | POA: Diagnosis not present

## 2024-04-29 DIAGNOSIS — M542 Cervicalgia: Secondary | ICD-10-CM | POA: Diagnosis not present

## 2024-04-29 DIAGNOSIS — M5416 Radiculopathy, lumbar region: Secondary | ICD-10-CM | POA: Diagnosis not present

## 2024-04-30 ENCOUNTER — Encounter: Payer: Self-pay | Admitting: Family Medicine

## 2024-04-30 ENCOUNTER — Ambulatory Visit (INDEPENDENT_AMBULATORY_CARE_PROVIDER_SITE_OTHER): Admitting: Family Medicine

## 2024-04-30 ENCOUNTER — Ambulatory Visit: Payer: Self-pay | Admitting: Family Medicine

## 2024-04-30 VITALS — BP 142/81 | HR 75 | Ht 62.0 in | Wt 241.0 lb

## 2024-04-30 DIAGNOSIS — H9202 Otalgia, left ear: Secondary | ICD-10-CM | POA: Diagnosis not present

## 2024-04-30 DIAGNOSIS — F411 Generalized anxiety disorder: Secondary | ICD-10-CM

## 2024-04-30 DIAGNOSIS — B349 Viral infection, unspecified: Secondary | ICD-10-CM

## 2024-04-30 DIAGNOSIS — G4700 Insomnia, unspecified: Secondary | ICD-10-CM | POA: Diagnosis not present

## 2024-04-30 DIAGNOSIS — Z6841 Body Mass Index (BMI) 40.0 and over, adult: Secondary | ICD-10-CM

## 2024-04-30 DIAGNOSIS — E66813 Obesity, class 3: Secondary | ICD-10-CM

## 2024-04-30 LAB — POC SOFIA 2 FLU + SARS ANTIGEN FIA
Influenza A, POC: NEGATIVE
Influenza B, POC: NEGATIVE
SARS Coronavirus 2 Ag: NEGATIVE

## 2024-04-30 MED ORDER — FLUCONAZOLE 150 MG PO TABS: 150.0000 mg | ORAL_TABLET | Freq: Once | ORAL | 0 refills | Status: DC

## 2024-04-30 MED ORDER — AZITHROMYCIN 250 MG PO TABS: ORAL_TABLET | ORAL | 0 refills | Status: AC

## 2024-05-01 ENCOUNTER — Other Ambulatory Visit: Payer: Self-pay

## 2024-05-01 DIAGNOSIS — R202 Paresthesia of skin: Secondary | ICD-10-CM

## 2024-05-02 ENCOUNTER — Encounter: Admitting: Neurology

## 2024-05-02 ENCOUNTER — Encounter: Payer: Self-pay | Admitting: Neurology

## 2024-05-02 NOTE — Progress Notes (Signed)
 Established Patient Office Visit  Subjective    Patient ID: Kristin Hahn, female    DOB: Aug 19, 1965  Age: 58 y.o. MRN: [id number]  CC:  Chief Complaint  Patient presents with   Fatigue    Pt reports feeling weak and getting very sweaty at work today.     HPI Kristin Hahn presents with complaint of being feverish and feeling weak. She does work in a childcare center but is unaware of any specific contacts or exposures. She also reports that she is trying to find a therapist to help with her anxiety. She has a n appt with consultant about her persistent ear pain with difficulty hearing.   Outpatient Encounter Medications as of 04/30/2024  Medication Sig   azithromycin  (ZITHROMAX ) 250 MG tablet Take 2 tablets on day 1, then 1 tablet daily on days 2 through 5   [EXPIRED] fluconazole  (DIFLUCAN ) 150 MG tablet Take 1 tablet (150 mg total) by mouth once for 1 dose.   albuterol  (PROVENTIL  HFA;VENTOLIN  HFA) 108 (90 Base) MCG/ACT inhaler Inhale 2 puffs into the lungs every 4 (four) hours as needed. Wheezing and shortness of breath   albuterol  (PROVENTIL ) (2.5 MG/3ML) 0.083% nebulizer solution Take 3 mLs (2.5 mg total) by nebulization every 4 (four) hours as needed for wheezing or shortness of breath.   amLODipine (NORVASC) 5 MG tablet Take 5 mg by mouth daily.   buPROPion  (WELLBUTRIN  XL) 300 MG 24 hr tablet Take 1 tablet (300 mg total) by mouth daily.   cefdinir  (OMNICEF ) 300 MG capsule Take 1 capsule (300 mg total) by mouth 2 (two) times daily.   ciprofloxacin -dexamethasone  (CIPRODEX ) OTIC suspension Place 4 drops into the left ear 2 (two) times daily.   cloNIDine  (CATAPRES ) 0.1 MG tablet Take 0.1 mg by mouth daily as needed (sleep).    diclofenac (VOLTAREN) 75 MG EC tablet TK 1 T PO BID WF OR MILK AND WF PRN   diclofenac sodium (VOLTAREN) 1 % GEL APP 2 -4 GRAMS AA QID PRN   Fezolinetant  (VEOZAH ) 45 MG TABS Take 1 tablet (45 mg total) by mouth daily.   fluticasone  furoate-vilanterol  (BREO ELLIPTA ) 200-25 MCG/INH AEPB Inhale 1 puff into the lungs daily.   LINZESS 145 MCG CAPS capsule TK ONE C PO  D PRN   olmesartan (BENICAR) 40 MG tablet 1 tablet   ondansetron  (ZOFRAN -ODT) 4 MG disintegrating tablet Take 1 tablet (4 mg total) by mouth every 8 (eight) hours as needed.   oxyCODONE-acetaminophen (PERCOCET) 10-325 MG tablet TK 1 T PO  Q 6 H AND AT BEDTIME AS NEEDED   predniSONE  (STERAPRED UNI-PAK 21 TAB) 10 MG (21) TBPK tablet Take po daily as recommended   triamcinolone  ointment (KENALOG ) 0.5 % Apply topically 2 (two) times daily.   Vitamin D, Ergocalciferol, (DRISDOL) 1.25 MG (50000 UT) CAPS capsule    [DISCONTINUED] norethindrone  (MICRONOR ,CAMILA ,ERRIN ) 0.35 MG tablet Take 1 tablet (0.35 mg total) by mouth daily. (Patient not taking: Reported on 07/30/2014)   No facility-administered encounter medications on file as of 04/30/2024.    Past Medical History:  Diagnosis Date   Asthma    Facet joint disease    Gout    Hypertension    Kidney stone    Renal disorder     Past Surgical History:  Procedure Laterality Date   ABDOMINAL HYSTERECTOMY     ADENOIDECTOMY     keloid removal     kidney stone removal     oophrectomy     right  SPINAL CORD STIMULATOR INSERTION  2020   TONSILLECTOMY     TUBAL LIGATION      Family History  Problem Relation Age of Onset   Diabetes Mother    Hypertension Mother    Breast cancer Mother        2017, 2020   Diabetes Father    Hypertension Father    Eczema Daughter    Asthma Daughter    Allergic rhinitis Neg Hx    Angioedema Neg Hx    Immunodeficiency Neg Hx    Urticaria Neg Hx     Social History   Socioeconomic History   Marital status: Married    Spouse name: Not on file   Number of children: Not on file   Years of education: Not on file   Highest education level: Not on file  Occupational History   Not on file  Tobacco Use   Smoking status: Never    Passive exposure: Never   Smokeless tobacco: Never  Vaping  Use   Vaping status: Never Used  Substance and Sexual Activity   Alcohol  use: No    Alcohol /week: 0.0 standard drinks of alcohol    Drug use: No   Sexual activity: Not Currently    Partners: Male    Birth control/protection: Surgical  Other Topics Concern   Not on file  Social History Narrative   Not on file   Social Drivers of Health   Financial Resource Strain: Not on file  Food Insecurity: Not on file  Transportation Needs: Not on file  Physical Activity: Not on file  Stress: Not on file  Social Connections: Not on file  Intimate Partner Violence: Not on file    Review of Systems  HENT:  Positive for ear pain and hearing loss.   Neurological:  Positive for weakness.  Psychiatric/Behavioral:  Positive for depression. The patient is nervous/anxious and has insomnia.   All other systems reviewed and are negative.       Objective    BP (!) 142/81   Pulse 75   Ht 5' 2 (1.575 m)   Wt 241 lb (109.3 kg)   SpO2 97%   BMI 44.08 kg/m   Physical Exam Vitals and nursing note reviewed.  Constitutional:      General: She is not in acute distress. HENT:     Right Ear: Tympanic membrane, ear canal and external ear normal.     Left Ear: Decreased hearing noted. Tenderness present.     Ears:     Comments: Left canals with minimal swelling - no erythema Cardiovascular:     Rate and Rhythm: Normal rate and regular rhythm.  Pulmonary:     Effort: Pulmonary effort is normal.     Breath sounds: Normal breath sounds.  Abdominal:     Palpations: Abdomen is soft.     Tenderness: There is no abdominal tenderness.  Musculoskeletal:     Comments: Patient utilizing rollater for stability  Neurological:     General: No focal deficit present.     Mental Status: She is alert and oriented to person, place, and time.  Psychiatric:        Mood and Affect: Mood is anxious.        Behavior: Behavior normal. Behavior is cooperative.         Assessment & Plan:   1. Viral  syndrome (Primary) Conservative viral management including adequate rest and fluids with tylenol/nsaids prn - COVID-19, Flu A+B and RSV - POC SOFIA 2  FLU + SARS ANTIGEN FIA  2. Insomnia, unspecified type   3. Left ear pain Zithromax  and diflucan  prescribed. Keep scheduled appt with consultant.   4. Anxiety state Recommend patient continue to seek a counselor and continue   5. Class 3 severe obesity due to excess calories with serious comorbidity and body mass index (BMI) of 40.0 to 44.9 in adult     No follow-ups on file.   Tanda Raguel SQUIBB, MD

## 2024-05-08 DIAGNOSIS — H6240 Otitis externa in other diseases classified elsewhere, unspecified ear: Secondary | ICD-10-CM | POA: Diagnosis not present

## 2024-05-08 DIAGNOSIS — H90A32 Mixed conductive and sensorineural hearing loss, unilateral, left ear with restricted hearing on the contralateral side: Secondary | ICD-10-CM | POA: Diagnosis not present

## 2024-05-08 DIAGNOSIS — B369 Superficial mycosis, unspecified: Secondary | ICD-10-CM | POA: Diagnosis not present

## 2024-05-08 DIAGNOSIS — H66012 Acute suppurative otitis media with spontaneous rupture of ear drum, left ear: Secondary | ICD-10-CM | POA: Diagnosis not present

## 2024-05-11 ENCOUNTER — Other Ambulatory Visit: Payer: Self-pay | Admitting: Family Medicine

## 2024-05-11 DIAGNOSIS — F322 Major depressive disorder, single episode, severe without psychotic features: Secondary | ICD-10-CM

## 2024-05-11 DIAGNOSIS — R4586 Emotional lability: Secondary | ICD-10-CM

## 2024-05-16 ENCOUNTER — Other Ambulatory Visit: Payer: Self-pay | Admitting: Family Medicine

## 2024-05-16 MED ORDER — FLUCONAZOLE 150 MG PO TABS: 150.0000 mg | ORAL_TABLET | Freq: Once | ORAL | 0 refills | Status: AC

## 2024-05-17 ENCOUNTER — Other Ambulatory Visit: Payer: Self-pay | Admitting: Family Medicine

## 2024-05-20 ENCOUNTER — Other Ambulatory Visit: Payer: Self-pay | Admitting: Family Medicine

## 2024-05-20 MED ORDER — DULOXETINE HCL 20 MG PO CPEP: 20.0000 mg | ORAL_CAPSULE | Freq: Every day | ORAL | 1 refills | Status: DC

## 2024-05-24 DIAGNOSIS — F331 Major depressive disorder, recurrent, moderate: Secondary | ICD-10-CM | POA: Diagnosis not present

## 2024-05-24 DIAGNOSIS — F411 Generalized anxiety disorder: Secondary | ICD-10-CM | POA: Diagnosis not present

## 2024-05-27 DIAGNOSIS — F331 Major depressive disorder, recurrent, moderate: Secondary | ICD-10-CM | POA: Diagnosis not present

## 2024-06-11 DIAGNOSIS — F331 Major depressive disorder, recurrent, moderate: Secondary | ICD-10-CM | POA: Diagnosis not present

## 2024-06-17 DIAGNOSIS — F331 Major depressive disorder, recurrent, moderate: Secondary | ICD-10-CM | POA: Diagnosis not present

## 2024-06-17 DIAGNOSIS — F411 Generalized anxiety disorder: Secondary | ICD-10-CM | POA: Diagnosis not present

## 2024-06-18 ENCOUNTER — Other Ambulatory Visit: Payer: Self-pay | Admitting: Family Medicine

## 2024-06-18 MED ORDER — OLMESARTAN MEDOXOMIL 40 MG PO TABS: 40.0000 mg | ORAL_TABLET | Freq: Every day | ORAL | 0 refills | Status: DC

## 2024-06-25 ENCOUNTER — Encounter: Payer: Self-pay | Admitting: Family Medicine

## 2024-06-25 ENCOUNTER — Ambulatory Visit: Admitting: Family Medicine

## 2024-06-25 VITALS — BP 130/81 | HR 81 | Ht 62.0 in | Wt 246.4 lb

## 2024-06-25 DIAGNOSIS — R350 Frequency of micturition: Secondary | ICD-10-CM

## 2024-06-25 DIAGNOSIS — F419 Anxiety disorder, unspecified: Secondary | ICD-10-CM | POA: Diagnosis not present

## 2024-06-25 DIAGNOSIS — F32A Depression, unspecified: Secondary | ICD-10-CM

## 2024-06-25 DIAGNOSIS — B349 Viral infection, unspecified: Secondary | ICD-10-CM | POA: Diagnosis not present

## 2024-06-25 DIAGNOSIS — I10 Essential (primary) hypertension: Secondary | ICD-10-CM | POA: Diagnosis not present

## 2024-06-25 DIAGNOSIS — R4586 Emotional lability: Secondary | ICD-10-CM

## 2024-06-25 LAB — POCT URINE DIPSTICK
Bilirubin, UA: NEGATIVE
Blood, UA: NEGATIVE
Glucose, UA: NEGATIVE mg/dL
Ketones, POC UA: NEGATIVE mg/dL
Nitrite, UA: NEGATIVE
POC PROTEIN,UA: 30 — AB
Spec Grav, UA: 1.025 (ref 1.010–1.025)
Urobilinogen, UA: 0.2 U/dL
pH, UA: 6.5 (ref 5.0–8.0)

## 2024-06-25 MED ORDER — DICLOFENAC SODIUM 75 MG PO TBEC: 75.0000 mg | DELAYED_RELEASE_TABLET | Freq: Two times a day (BID) | ORAL | 1 refills | Status: AC

## 2024-06-25 MED ORDER — POTASSIUM CHLORIDE CRYS ER 10 MEQ PO TBCR: 10.0000 meq | EXTENDED_RELEASE_TABLET | Freq: Two times a day (BID) | ORAL | 1 refills | Status: DC

## 2024-06-25 MED ORDER — AMLODIPINE BESYLATE 5 MG PO TABS: 5.0000 mg | ORAL_TABLET | Freq: Every day | ORAL | 1 refills | Status: AC

## 2024-06-25 NOTE — Progress Notes (Unsigned)
 Established Patient Office Visit  Subjective    Patient ID: Kristin Hahn, female    DOB: [birth year]  Age: 58 y.o. MRN: [id number]  CC:  Chief Complaint  Patient presents with   Medical Management of Chronic Issues    HPI [name] presents with complaint of cold symptoms as well as frequent urinary symptoms particularly at night. She reports that she is unable to sleep well because the frequency interrupts her sleep.   Outpatient Encounter Medications as of 06/25/2024  Medication Sig   albuterol  (PROVENTIL  HFA;VENTOLIN  HFA) 108 (90 Base) MCG/ACT inhaler Inhale 2 puffs into the lungs every 4 (four) hours as needed. Wheezing and shortness of breath   albuterol  (PROVENTIL ) (2.5 MG/3ML) 0.083% nebulizer solution Take 3 mLs (2.5 mg total) by nebulization every 4 (four) hours as needed for wheezing or shortness of breath.   buPROPion  (WELLBUTRIN  XL) 300 MG 24 hr tablet Take 1 tablet (300 mg total) by mouth daily.   cefdinir  (OMNICEF ) 300 MG capsule Take 1 capsule (300 mg total) by mouth 2 (two) times daily.   ciprofloxacin -dexamethasone  (CIPRODEX ) OTIC suspension Place 4 drops into the left ear 2 (two) times daily.   cloNIDine  (CATAPRES ) 0.1 MG tablet Take 0.1 mg by mouth daily as needed (sleep).    diclofenac sodium (VOLTAREN) 1 % GEL APP 2 -4 GRAMS AA QID PRN   DULoxetine (CYMBALTA) 20 MG capsule Take 1 capsule (20 mg total) by mouth daily.   Fezolinetant  (VEOZAH ) 45 MG TABS Take 1 tablet (45 mg total) by mouth daily.   fluticasone  furoate-vilanterol (BREO ELLIPTA ) 200-25 MCG/INH AEPB Inhale 1 puff into the lungs daily.   LINZESS 145 MCG CAPS capsule TK ONE C PO  D PRN   olmesartan (BENICAR) 40 MG tablet Take 1 tablet (40 mg total) by mouth daily.   ondansetron  (ZOFRAN -ODT) 4 MG disintegrating tablet Take 1 tablet (4 mg total) by mouth every 8 (eight) hours as needed.   oxyCODONE-acetaminophen (PERCOCET) 10-325 MG tablet TK 1 T PO  Q 6 H AND AT BEDTIME AS NEEDED   potassium  chloride (KLOR-CON  M) 10 MEQ tablet Take 1 tablet (10 mEq total) by mouth 2 (two) times daily.   predniSONE  (STERAPRED UNI-PAK 21 TAB) 10 MG (21) TBPK tablet Take po daily as recommended   triamcinolone  ointment (KENALOG ) 0.5 % Apply topically 2 (two) times daily.   Vitamin D, Ergocalciferol, (DRISDOL) 1.25 MG (50000 UT) CAPS capsule    [DISCONTINUED] amLODipine (NORVASC) 5 MG tablet Take 5 mg by mouth daily.   [DISCONTINUED] diclofenac (VOLTAREN) 75 MG EC tablet TK 1 T PO BID WF OR MILK AND WF PRN   amLODipine (NORVASC) 5 MG tablet Take 1 tablet (5 mg total) by mouth daily.   diclofenac (VOLTAREN) 75 MG EC tablet Take 1 tablet (75 mg total) by mouth 2 (two) times daily.   [DISCONTINUED] norethindrone  (MICRONOR ,CAMILA ,ERRIN ) 0.35 MG tablet Take 1 tablet (0.35 mg total) by mouth daily. (Patient not taking: Reported on 07/30/2014)   No facility-administered encounter medications on file as of 06/25/2024.    Past Medical History:  Diagnosis Date   Asthma    Facet joint disease    Gout    Hypertension    Kidney stone    Renal disorder     Past Surgical History:  Procedure Laterality Date   ABDOMINAL HYSTERECTOMY     ADENOIDECTOMY     keloid removal     kidney stone removal     oophrectomy  right   SPINAL CORD STIMULATOR INSERTION  2020   TONSILLECTOMY     TUBAL LIGATION      Family History  Problem Relation Age of Onset   Diabetes Mother    Hypertension Mother    Breast cancer Mother        2017, 2020   Diabetes Father    Hypertension Father    Eczema Daughter    Asthma Daughter    Allergic rhinitis Neg Hx    Angioedema Neg Hx    Immunodeficiency Neg Hx    Urticaria Neg Hx     Social History   Socioeconomic History   Marital status: Married    Spouse name: Not on file   Number of children: Not on file   Years of education: Not on file   Highest education level: Not on file  Occupational History   Not on file  Tobacco Use   Smoking status: Never     Passive exposure: Never   Smokeless tobacco: Never  Vaping Use   Vaping status: Never Used  Substance and Sexual Activity   Alcohol  use: No    Alcohol /week: 0.0 standard drinks of alcohol    Drug use: No   Sexual activity: Not Currently    Partners: Male    Birth control/protection: Surgical  Other Topics Concern   Not on file  Social History Narrative   Not on file   Social Drivers of Health   Financial Resource Strain: Low Risk  (06/25/2024)   Overall Financial Resource Strain (CARDIA)    Difficulty of Paying Living Expenses: Not hard at all  Food Insecurity: No Food Insecurity (06/25/2024)   Hunger Vital Sign    Worried About Running Out of Food in the Last Year: Never true    Ran Out of Food in the Last Year: Never true  Transportation Needs: No Transportation Needs (06/25/2024)   PRAPARE - Administrator, Civil Service (Medical): No    Lack of Transportation (Non-Medical): No  Physical Activity: Inactive (06/25/2024)   Exercise Vital Sign    Days of Exercise per Week: 0 days    Minutes of Exercise per Session: 0 min  Stress: Stress Concern Present (06/25/2024)   [employer] of Occupational Health - Occupational Stress Questionnaire    Feeling of Stress: Very much  Social Connections: Socially Integrated (06/25/2024)   Social Connection and Isolation Panel    Frequency of Communication with Friends and Family: More than three times a week    Frequency of Social Gatherings with Friends and Family: Three times a week    Attends Religious Services: More than 4 times per year    Active Member of Clubs or Organizations: Yes    Attends Banker Meetings: More than 4 times per year    Marital Status: Married  Catering Manager Violence: Not At Risk (06/25/2024)   Humiliation, Afraid, Rape, and Kick questionnaire    Fear of Current or Ex-Partner: No    Emotionally Abused: No    Physically Abused: No    Sexually Abused: No    Review of Systems   HENT:  Positive for congestion.   Respiratory:  Positive for cough.   Genitourinary:  Positive for frequency.  All other systems reviewed and are negative.       Objective    BP 130/81   Pulse 81   Ht 5' 2 (1.575 m)   Wt 246 lb 6.4 oz (111.8 kg)   SpO2 97%  BMI 45.07 kg/m   Physical Exam Vitals and nursing note reviewed.  Constitutional:      General: She is not in acute distress. Cardiovascular:     Rate and Rhythm: Normal rate and regular rhythm.  Pulmonary:     Effort: Pulmonary effort is normal.     Breath sounds: Normal breath sounds.  Abdominal:     Palpations: Abdomen is soft.     Tenderness: There is no abdominal tenderness.  Neurological:     General: No focal deficit present.     Mental Status: She is alert and oriented to person, place, and time.         Assessment & Plan:  1. Acute viral syndrome (Primary) Recommend conservative viral treatment including adequate fluids and rest and tylenol/nsaids prn  2. Urinary frequency Increased protein. Cona consider urology for further eval/mgt - POCT URINE DIPSTICK  3. Anxiety and depression Patient restarted cymbalta.   4. Essential hypertension Appears stable. Continue    No follow-ups on file.   Tanda Raguel SQUIBB, MD

## 2024-06-26 DIAGNOSIS — F331 Major depressive disorder, recurrent, moderate: Secondary | ICD-10-CM | POA: Diagnosis not present

## 2024-06-26 DIAGNOSIS — F411 Generalized anxiety disorder: Secondary | ICD-10-CM | POA: Diagnosis not present

## 2024-06-27 ENCOUNTER — Encounter: Payer: Self-pay | Admitting: Family Medicine

## 2024-06-27 DIAGNOSIS — M5416 Radiculopathy, lumbar region: Secondary | ICD-10-CM | POA: Diagnosis not present

## 2024-06-27 DIAGNOSIS — G894 Chronic pain syndrome: Secondary | ICD-10-CM | POA: Diagnosis not present

## 2024-06-27 DIAGNOSIS — Z79891 Long term (current) use of opiate analgesic: Secondary | ICD-10-CM | POA: Diagnosis not present

## 2024-06-27 DIAGNOSIS — M47896 Other spondylosis, lumbar region: Secondary | ICD-10-CM | POA: Diagnosis not present

## 2024-07-01 ENCOUNTER — Other Ambulatory Visit: Payer: Self-pay | Admitting: Family Medicine

## 2024-07-01 MED ORDER — OLMESARTAN MEDOXOMIL-HCTZ 40-25 MG PO TABS: 1.0000 | ORAL_TABLET | Freq: Every day | ORAL | 0 refills | Status: AC

## 2024-07-02 DIAGNOSIS — F411 Generalized anxiety disorder: Secondary | ICD-10-CM | POA: Diagnosis not present

## 2024-07-03 ENCOUNTER — Ambulatory Visit: Payer: Self-pay | Admitting: Family Medicine

## 2024-07-05 DIAGNOSIS — F411 Generalized anxiety disorder: Secondary | ICD-10-CM | POA: Diagnosis not present

## 2024-07-09 ENCOUNTER — Other Ambulatory Visit: Payer: Self-pay | Admitting: Family Medicine

## 2024-07-09 MED ORDER — SERTRALINE HCL 50 MG PO TABS: 50.0000 mg | ORAL_TABLET | Freq: Every day | ORAL | 0 refills | Status: DC

## 2024-07-11 ENCOUNTER — Encounter: Admitting: Neurology

## 2024-07-11 ENCOUNTER — Encounter: Payer: Self-pay | Admitting: Neurology

## 2024-07-11 DIAGNOSIS — Z029 Encounter for administrative examinations, unspecified: Secondary | ICD-10-CM

## 2024-07-13 DIAGNOSIS — E559 Vitamin D deficiency, unspecified: Secondary | ICD-10-CM | POA: Diagnosis not present

## 2024-07-13 DIAGNOSIS — E7800 Pure hypercholesterolemia, unspecified: Secondary | ICD-10-CM | POA: Diagnosis not present

## 2024-07-13 DIAGNOSIS — R5383 Other fatigue: Secondary | ICD-10-CM | POA: Diagnosis not present

## 2024-07-13 DIAGNOSIS — M129 Arthropathy, unspecified: Secondary | ICD-10-CM | POA: Diagnosis not present

## 2024-07-14 DIAGNOSIS — Z131 Encounter for screening for diabetes mellitus: Secondary | ICD-10-CM | POA: Diagnosis not present

## 2024-07-14 DIAGNOSIS — Z79899 Other long term (current) drug therapy: Secondary | ICD-10-CM | POA: Diagnosis not present

## 2024-07-14 DIAGNOSIS — M129 Arthropathy, unspecified: Secondary | ICD-10-CM | POA: Diagnosis not present

## 2024-07-14 DIAGNOSIS — R5383 Other fatigue: Secondary | ICD-10-CM | POA: Diagnosis not present

## 2024-07-15 DIAGNOSIS — F331 Major depressive disorder, recurrent, moderate: Secondary | ICD-10-CM | POA: Diagnosis not present

## 2024-07-15 DIAGNOSIS — F411 Generalized anxiety disorder: Secondary | ICD-10-CM | POA: Diagnosis not present

## 2024-07-20 DIAGNOSIS — N200 Calculus of kidney: Secondary | ICD-10-CM | POA: Diagnosis not present

## 2024-07-26 DIAGNOSIS — F411 Generalized anxiety disorder: Secondary | ICD-10-CM | POA: Diagnosis not present

## 2024-07-26 DIAGNOSIS — F331 Major depressive disorder, recurrent, moderate: Secondary | ICD-10-CM | POA: Diagnosis not present

## 2024-08-02 DIAGNOSIS — F331 Major depressive disorder, recurrent, moderate: Secondary | ICD-10-CM | POA: Diagnosis not present

## 2024-08-02 DIAGNOSIS — F411 Generalized anxiety disorder: Secondary | ICD-10-CM | POA: Diagnosis not present

## 2024-08-13 ENCOUNTER — Ambulatory Visit: Payer: Self-pay | Admitting: Family Medicine

## 2024-08-15 ENCOUNTER — Ambulatory Visit: Payer: Self-pay

## 2024-08-15 NOTE — Telephone Encounter (Signed)
 FYI Only or Action Required?: Action required by provider: request for appointment.  Patient was last seen in primary care on 06/25/2024 by Tanda Bleacher, MD.  Called Nurse Triage reporting Cough.  Symptoms began a week ago.  Interventions attempted: OTC medications: Excedrin and ibuprofen  and Prescription medications: Reglan.  Symptoms are: gradually worsening.  Triage Disposition: See Physician Within 24 Hours  Patient/caregiver understands and will follow disposition?: Yes   Copied from CRM #[id number]. Topic: Clinical - Red Word Triage >> Aug 15, 2024  2:29 PM Hadassah PARAS wrote: Red Word that prompted transfer to Nurse Triage: Pt does not feel well, experiencing  Coughing, sneezing, constant headache that wont go away, bodyache, throat hurts, burning sensation in nostril. Pt's Anxiety through the roof. Transferred to NT Reason for Disposition  [1] Continuous (nonstop) coughing interferes with work or school AND [2] no improvement using cough treatment per Care Advice  Answer Assessment - Initial Assessment Questions 1. ONSET: When did the cough begin?       X 1 week  2. SEVERITY: How bad is the cough today?       Worse since onset  3. SPUTUM: Describe the color of your sputum (e.g., none, dry cough; clear, white, yellow, green)      denies  4. HEMOPTYSIS: Are you coughing up any blood? If Yes, ask: How much? (e.g., flecks, streaks, tablespoons, etc.)      denies  5. DIFFICULTY BREATHING: Are you having difficulty breathing? If Yes, ask: How bad is it? (e.g., mild, moderate, severe)       Denies difficulty breathing, however, sounds junky  6. FEVER: Do you have a fever? If Yes, ask: What is your temperature, how was it measured, and when did it start?     denies   7. CARDIAC HISTORY: Do you have any history of heart disease? (e.g., heart attack, congestive heart failure)       Per pt's chart, pt has hx of HTN  8. LUNG HISTORY: Do you have any history of  lung disease?  (e.g., pulmonary embolus, asthma, emphysema)       Per pt's chart, pt does not have lung hx  9. PE RISK FACTORS: Do you have a history of blood clots? (or: recent major surgery, recent prolonged travel, bedridden)       Per pt's chart, pt does not have PE risk factors  10. OTHER SYMPTOMS: Do you have any other symptoms? (e.g., runny nose, wheezing, chest pain)        Runny nose, sore throat 10/10, burning nostrils, sneezing, constant headache 12/10, and body aches  Pt reports cough, sore throat, headaches Pt is taking Ibuprofen , Excedrin, Reglan Pt offered an in clinic visit for tomorrow, 01.09.26, [facility]. Pt states she would prefer to see her PCP, who's soonest availability is in Feb. Routing to clinic to assist with scheduling Pt agrees with plan of care, will call back for any worsening symptoms  Protocols used: Cough - Acute Non-Productive-A-AH

## 2024-08-19 ENCOUNTER — Other Ambulatory Visit: Payer: Self-pay | Admitting: Family Medicine

## 2024-08-19 MED ORDER — SERTRALINE HCL 50 MG PO TABS: 50.0000 mg | ORAL_TABLET | Freq: Every day | ORAL | 0 refills | Status: AC

## 2024-08-19 MED ORDER — BUSPIRONE HCL 10 MG PO TABS: 10.0000 mg | ORAL_TABLET | Freq: Two times a day (BID) | ORAL | 0 refills | Status: DC

## 2024-08-28 ENCOUNTER — Other Ambulatory Visit: Payer: Self-pay | Admitting: Family Medicine

## 2024-09-11 ENCOUNTER — Other Ambulatory Visit: Payer: Self-pay | Admitting: Family Medicine

## 2024-09-26 ENCOUNTER — Encounter: Admitting: Neurology
